# Patient Record
Sex: Male | Born: 1960 | Race: White | Hispanic: No | Marital: Single | Smoking: Former smoker
Health system: Southern US, Community
[De-identification: ages and names within clinical notes are randomized; demographics above are authoritative.]

## PROBLEM LIST (undated history)

## (undated) DIAGNOSIS — T148XXA Other injury of unspecified body region, initial encounter: Secondary | ICD-10-CM

## (undated) DIAGNOSIS — F329 Major depressive disorder, single episode, unspecified: Secondary | ICD-10-CM

## (undated) DIAGNOSIS — E119 Type 2 diabetes mellitus without complications: Secondary | ICD-10-CM

## (undated) DIAGNOSIS — R05 Cough: Secondary | ICD-10-CM

## (undated) DIAGNOSIS — F32A Depression, unspecified: Secondary | ICD-10-CM

## (undated) DIAGNOSIS — I10 Essential (primary) hypertension: Secondary | ICD-10-CM

## (undated) DIAGNOSIS — R059 Cough, unspecified: Secondary | ICD-10-CM

## (undated) DIAGNOSIS — Z5189 Encounter for other specified aftercare: Secondary | ICD-10-CM

## (undated) DIAGNOSIS — C801 Malignant (primary) neoplasm, unspecified: Secondary | ICD-10-CM

## (undated) DIAGNOSIS — F419 Anxiety disorder, unspecified: Secondary | ICD-10-CM

## (undated) DIAGNOSIS — Z8719 Personal history of other diseases of the digestive system: Secondary | ICD-10-CM

## (undated) HISTORY — DX: Encounter for other specified aftercare: Z51.89

## (undated) HISTORY — DX: Essential (primary) hypertension: I10

## (undated) HISTORY — PX: HERNIA REPAIR: SHX51

## (undated) HISTORY — DX: Type 2 diabetes mellitus without complications: E11.9

---

## 1995-03-04 HISTORY — PX: WRIST RECONSTRUCTION: SHX2675

## 2000-01-04 ENCOUNTER — Emergency Department (HOSPITAL_COMMUNITY): Admission: EM | Admit: 2000-01-04 | Discharge: 2000-01-05 | Payer: Self-pay | Admitting: *Deleted

## 2000-01-04 ENCOUNTER — Encounter: Payer: Self-pay | Admitting: *Deleted

## 2012-11-19 ENCOUNTER — Encounter (INDEPENDENT_AMBULATORY_CARE_PROVIDER_SITE_OTHER): Payer: Self-pay | Admitting: General Surgery

## 2012-11-19 ENCOUNTER — Ambulatory Visit (INDEPENDENT_AMBULATORY_CARE_PROVIDER_SITE_OTHER): Payer: No Typology Code available for payment source | Admitting: General Surgery

## 2012-11-19 VITALS — BP 118/74 | HR 71 | Temp 98.6°F | Resp 16 | Ht 71.75 in | Wt 214.4 lb

## 2012-11-19 DIAGNOSIS — K429 Umbilical hernia without obstruction or gangrene: Secondary | ICD-10-CM

## 2012-11-19 NOTE — Progress Notes (Signed)
Patient ID: Allen Walters, male   DOB: [birth year], 52 y.o.   MRN: [id number]  No chief complaint on file.   HPI Allen Walters is a 52 y.o. male.  This patient is referred by Dr. Margo Aye for evaluation of an umbilical hernia. The patient says that he has noticed this for at least 5 years now and has been bigger at times. He said it was much more noticeable when he was heavier but he has lost some weight and it has gotten smaller.  He says that it does cause some discomfort. He describes a "tearing" sensation in the area although he denies any obstructive symptoms such as nausea vomiting or constipation.  He has never had a colonoscopy but denies any family hx of colon cancer.   HPI  Past Medical History  Diagnosis Date  . Hypertension   . Blood transfusion without reported diagnosis     Past Surgical History  Procedure Laterality Date  . Wrist reconstruction Right 1997    No family history on file.  Social History History  Substance Use Topics  . Smoking status: Not on file  . Smokeless tobacco: Not on file  . Alcohol Use: Not on file  nonsmoker, used to drink alcohol but no longer  Allergies no known allergies  Current Outpatient Prescriptions  Medication Sig Dispense Refill  . lisinopril (PRINIVIL,ZESTRIL) 10 MG tablet Take 10 mg by mouth daily.       No current facility-administered medications for this visit.    Review of Systems Review of Systems All other review of systems negative or noncontributory except as stated in the HPI  Blood pressure 118/74, pulse 71, temperature 98.6 F (37 C), temperature source Temporal, resp. rate 16, height 5' 11.75" (1.822 m), weight 214 lb 6.4 oz (97.251 kg).  Physical Exam Physical Exam Physical Exam  Vitals reviewed. Constitutional: He is oriented to person, place, and time. He appears well-developed and well-nourished. No distress.  HENT:  Head: Normocephalic and atraumatic.  Mouth/Throat: No oropharyngeal exudate.  Eyes:  Conjunctivae and EOM are normal. Pupils are equal, round, and reactive to light. Right eye exhibits no discharge. Left eye exhibits no discharge. No scleral icterus.  Neck: Normal range of motion. No tracheal deviation present.  Cardiovascular: Normal rate, regular rhythm and normal heart sounds.   Pulmonary/Chest: Effort normal and breath sounds normal. No stridor. No respiratory distress. He has no wheezes. He has no rales. He exhibits no tenderness.  Abdominal: Soft. Bowel sounds are normal. He exhibits no distension and no mass. There is no tenderness. There is no rebound and no guarding. small and easily reducible umbilical hernia.  nontender on exam today. Musculoskeletal: Normal range of motion. He exhibits no edema and no tenderness.  Neurological: He is alert and oriented to person, place, and time.  Skin: Skin is warm and dry. No rash noted. He is not diaphoretic. No erythema. No pallor.  Psychiatric: He has a normal mood and affect. His behavior is normal. Judgment and thought content normal.    Data Reviewed   Assessment    Reducible umbilical hernia He does have a fairly small but reducible umbilical hernia by history and exam. It is symptomatic however and he would like to have this repaired. We did discuss with him the options for continued observation versus surgical repair and he would like to have this repaired. I think that probably for this small umbilical hernia the best option would be for open umbilical hernia repair with possible  mesh and this is what I have recommended. We did discuss the pros and cons of the procedure and the risks. We did discuss the risk of infection, bleeding, pain, scarring, recurrence, bowel injury, and chronic pain or mesh infection and he expressed understanding and would like to proceed with open umbilical hernia repair with possible mesh    Plan    We will plan for open umbilical hernia repair with possible mesh        Payton Moder  DAVID 11/19/2012, 10:28 AM

## 2012-12-01 NOTE — Pre-Procedure Instructions (Signed)
Allen Walters  12/01/2012   Your procedure is scheduled on:  12/09/12  Report to [facility]  2 * 3 at 530 AM.  Call this number if you have problems the morning of surgery: [phone]   Remember:   Do not eat food or drink liquids after midnight.   Take these medicines the morning of surgery with A SIP OF WATER: none   Do not wear jewelry, make-up or nail polish.  Do not wear lotions, powders, or perfumes. You may wear deodorant.  Do not shave 48 hours prior to surgery. Men may shave face and neck.  Do not bring valuables to the hospital.  [facility] is not responsible                  for any belongings or valuables.               Contacts, dentures or bridgework may not be worn into surgery.  Leave suitcase in the car. After surgery it may be brought to your room.  For patients admitted to the hospital, discharge time is determined by your                treatment team.               Patients discharged the day of surgery will not be allowed to drive  home.  Name and phone number of your driver: family  Special Instructions: Shower using CHG 2 nights before surgery and the night before surgery.  If you shower the day of surgery use CHG.  Use special wash - you have one bottle of CHG for all showers.  You should use approximately 1/3 of the bottle for each shower.   Please read over the following fact sheets that you were given: Pain Booklet, Coughing and Deep Breathing and Surgical Site Infection Prevention

## 2012-12-02 ENCOUNTER — Encounter (HOSPITAL_COMMUNITY)
Admission: RE | Admit: 2012-12-02 | Discharge: 2012-12-02 | Disposition: A | Discharge status: Home / Self Care | Payer: No Typology Code available for payment source | Source: Ambulatory Visit | Attending: General Surgery | Admitting: General Surgery

## 2012-12-02 ENCOUNTER — Encounter (HOSPITAL_COMMUNITY): Payer: Self-pay

## 2012-12-02 ENCOUNTER — Encounter (HOSPITAL_COMMUNITY): Payer: Self-pay | Admitting: Pharmacy Technician

## 2012-12-02 VITALS — BP 148/83 | HR 94 | Temp 98.5°F | Resp 18 | Ht 71.25 in | Wt 212.4 lb

## 2012-12-02 DIAGNOSIS — Z01812 Encounter for preprocedural laboratory examination: Secondary | ICD-10-CM | POA: Insufficient documentation

## 2012-12-02 DIAGNOSIS — Z01818 Encounter for other preprocedural examination: Secondary | ICD-10-CM | POA: Insufficient documentation

## 2012-12-02 DIAGNOSIS — K429 Umbilical hernia without obstruction or gangrene: Secondary | ICD-10-CM

## 2012-12-02 HISTORY — DX: Major depressive disorder, single episode, unspecified: F32.9

## 2012-12-02 HISTORY — DX: Cough: R05

## 2012-12-02 HISTORY — DX: Anxiety disorder, unspecified: F41.9

## 2012-12-02 HISTORY — DX: Personal history of other diseases of the digestive system: Z87.19

## 2012-12-02 HISTORY — DX: Depression, unspecified: F32.A

## 2012-12-02 HISTORY — DX: Malignant (primary) neoplasm, unspecified: C80.1

## 2012-12-02 HISTORY — DX: Other injury of unspecified body region, initial encounter: T14.8XXA

## 2012-12-02 HISTORY — DX: Cough, unspecified: R05.9

## 2012-12-02 LAB — BASIC METABOLIC PANEL
Chloride: 100 mEq/L (ref 96–112)
Creatinine, Ser: 0.76 mg/dL (ref 0.50–1.35)
GFR calc Af Amer: >90 mL/min (ref >90)
Glucose, Bld: 106 mg/dL — ABNORMAL HIGH (ref 70–99)

## 2012-12-02 LAB — CBC
HCT: 42.2 % (ref 39.0–52.0)
MCH: 32.5 pg (ref 26.0–34.0)
Platelets: 340 10*3/uL (ref 150–400)
RDW: 13 % (ref 11.5–15.5)
WBC: 9.6 10*3/uL (ref 4.0–10.5)

## 2012-12-08 MED ORDER — CEFAZOLIN SODIUM-DEXTROSE 2-3 GM-% IV SOLR
2.0000 g | INTRAVENOUS | Status: AC
  Administered 2012-12-09: 2 g via INTRAVENOUS
  Filled 2012-12-08: qty 50

## 2012-12-09 ENCOUNTER — Encounter (HOSPITAL_COMMUNITY): Payer: Self-pay | Admitting: *Deleted

## 2012-12-09 ENCOUNTER — Ambulatory Visit (HOSPITAL_COMMUNITY)
Admission: RE | Admit: 2012-12-09 | Discharge: 2012-12-10 | Disposition: A | Discharge status: Home / Self Care | Payer: No Typology Code available for payment source | Source: Ambulatory Visit | Attending: General Surgery | Admitting: General Surgery

## 2012-12-09 ENCOUNTER — Encounter (HOSPITAL_COMMUNITY)
Admission: RE | Disposition: A | Discharge status: Home / Self Care | Payer: Self-pay | Source: Ambulatory Visit | Attending: General Surgery

## 2012-12-09 ENCOUNTER — Encounter (HOSPITAL_COMMUNITY): Payer: No Typology Code available for payment source | Admitting: Anesthesiology

## 2012-12-09 ENCOUNTER — Ambulatory Visit (HOSPITAL_COMMUNITY): Payer: No Typology Code available for payment source | Admitting: Anesthesiology

## 2012-12-09 DIAGNOSIS — K429 Umbilical hernia without obstruction or gangrene: Secondary | ICD-10-CM | POA: Insufficient documentation

## 2012-12-09 DIAGNOSIS — F3289 Other specified depressive episodes: Secondary | ICD-10-CM | POA: Insufficient documentation

## 2012-12-09 DIAGNOSIS — F329 Major depressive disorder, single episode, unspecified: Secondary | ICD-10-CM | POA: Insufficient documentation

## 2012-12-09 DIAGNOSIS — I10 Essential (primary) hypertension: Secondary | ICD-10-CM | POA: Insufficient documentation

## 2012-12-09 DIAGNOSIS — F411 Generalized anxiety disorder: Secondary | ICD-10-CM | POA: Insufficient documentation

## 2012-12-09 HISTORY — PX: UMBILICAL HERNIA REPAIR: SHX196

## 2012-12-09 HISTORY — PX: INSERTION OF MESH: SHX5868

## 2012-12-09 SURGERY — REPAIR, HERNIA, UMBILICAL, ADULT
Anesthesia: General | Site: Abdomen | Wound class: Clean

## 2012-12-09 MED ORDER — ONDANSETRON HCL 4 MG/2ML IJ SOLN: 4.0000 mg | Freq: Four times a day (QID) | INTRAMUSCULAR | Status: DC | PRN

## 2012-12-09 MED ORDER — LACTATED RINGERS IV SOLN
INTRAVENOUS | Status: DC | PRN
  Administered 2012-12-09 (×2): via INTRAVENOUS

## 2012-12-09 MED ORDER — ONDANSETRON HCL 4 MG/2ML IJ SOLN
INTRAMUSCULAR | Status: DC | PRN
  Administered 2012-12-09: 4 mg via INTRAMUSCULAR

## 2012-12-09 MED ORDER — HYDROCODONE-ACETAMINOPHEN 5-325 MG PO TABS: 1.0000 | ORAL_TABLET | ORAL | Status: DC | PRN

## 2012-12-09 MED ORDER — OXYCODONE HCL 5 MG PO TABS
ORAL_TABLET | ORAL | Status: AC
  Administered 2012-12-09: 5 mg
  Filled 2012-12-09: qty 1

## 2012-12-09 MED ORDER — LIDOCAINE-EPINEPHRINE (PF) 1 %-1:200000 IJ SOLN
INTRAMUSCULAR | Status: AC
  Filled 2012-12-09: qty 10

## 2012-12-09 MED ORDER — HEPARIN SODIUM (PORCINE) 5000 UNIT/ML IJ SOLN
5000.0000 [IU] | Freq: Three times a day (TID) | INTRAMUSCULAR | Status: DC
  Administered 2012-12-09 – 2012-12-10 (×3): 5000 [IU] via SUBCUTANEOUS
  Filled 2012-12-09 (×5): qty 1

## 2012-12-09 MED ORDER — DEXTROSE 5 % IV SOLN
INTRAVENOUS | Status: DC | PRN
  Administered 2012-12-09: 08:00:00 via INTRAVENOUS

## 2012-12-09 MED ORDER — HYDROMORPHONE HCL PF 1 MG/ML IJ SOLN
INTRAMUSCULAR | Status: AC
  Filled 2012-12-09: qty 1

## 2012-12-09 MED ORDER — ONDANSETRON HCL 4 MG PO TABS: 4.0000 mg | ORAL_TABLET | Freq: Four times a day (QID) | ORAL | Status: DC | PRN

## 2012-12-09 MED ORDER — FENTANYL CITRATE 0.05 MG/ML IJ SOLN
INTRAMUSCULAR | Status: DC | PRN
  Administered 2012-12-09: 125 ug via INTRAVENOUS

## 2012-12-09 MED ORDER — KETOROLAC TROMETHAMINE 30 MG/ML IJ SOLN
30.0000 mg | Freq: Once | INTRAMUSCULAR | Status: AC
  Administered 2012-12-09: 30 mg via INTRAVENOUS

## 2012-12-09 MED ORDER — 0.9 % SODIUM CHLORIDE (POUR BTL) OPTIME
TOPICAL | Status: DC | PRN
  Administered 2012-12-09: 1000 mL

## 2012-12-09 MED ORDER — KETOROLAC TROMETHAMINE 30 MG/ML IJ SOLN
INTRAMUSCULAR | Status: AC
  Filled 2012-12-09: qty 1

## 2012-12-09 MED ORDER — PROPOFOL 10 MG/ML IV BOLUS
INTRAVENOUS | Status: DC | PRN
  Administered 2012-12-09: 290 mg via INTRAVENOUS

## 2012-12-09 MED ORDER — LIDOCAINE HCL (CARDIAC) 20 MG/ML IV SOLN
INTRAVENOUS | Status: DC | PRN
  Administered 2012-12-09: 80 mg via INTRAVENOUS

## 2012-12-09 MED ORDER — NEOSTIGMINE METHYLSULFATE 1 MG/ML IJ SOLN
INTRAMUSCULAR | Status: DC | PRN
  Administered 2012-12-09: 3 mg via INTRAVENOUS
  Administered 2012-12-09: 2 mg via INTRAVENOUS

## 2012-12-09 MED ORDER — GLYCOPYRROLATE 0.2 MG/ML IJ SOLN
INTRAMUSCULAR | Status: DC | PRN
  Administered 2012-12-09: .3 mg via INTRAVENOUS
  Administered 2012-12-09: .5 mg via INTRAVENOUS
  Administered 2012-12-09: 0.2 mg via INTRAVENOUS

## 2012-12-09 MED ORDER — HYDROMORPHONE HCL PF 1 MG/ML IJ SOLN
0.2500 mg | INTRAMUSCULAR | Status: DC | PRN
  Administered 2012-12-09 (×6): 0.5 mg via INTRAVENOUS

## 2012-12-09 MED ORDER — BUPIVACAINE HCL (PF) 0.25 % IJ SOLN
INTRAMUSCULAR | Status: AC
  Filled 2012-12-09: qty 30

## 2012-12-09 MED ORDER — PHENYLEPHRINE HCL 10 MG/ML IJ SOLN
INTRAMUSCULAR | Status: DC | PRN
  Administered 2012-12-09: 40 ug via INTRAVENOUS
  Administered 2012-12-09 (×3): 80 ug via INTRAVENOUS

## 2012-12-09 MED ORDER — ROCURONIUM BROMIDE 100 MG/10ML IV SOLN
INTRAVENOUS | Status: DC | PRN
  Administered 2012-12-09: 45 mg via INTRAVENOUS

## 2012-12-09 MED ORDER — HYDROCODONE-ACETAMINOPHEN 5-325 MG PO TABS
1.0000 | ORAL_TABLET | ORAL | Status: DC | PRN
  Administered 2012-12-09 – 2012-12-10 (×6): 2 via ORAL
  Filled 2012-12-09 (×6): qty 2

## 2012-12-09 MED ORDER — ONDANSETRON HCL 4 MG/2ML IJ SOLN: 4.0000 mg | Freq: Once | INTRAMUSCULAR | Status: DC | PRN

## 2012-12-09 MED ORDER — BUPIVACAINE HCL 0.25 % IJ SOLN
INTRAMUSCULAR | Status: DC | PRN
  Administered 2012-12-09: 09:00:00 via INTRAMUSCULAR

## 2012-12-09 SURGICAL SUPPLY — 53 items
ADH SKN CLS APL DERMABOND .7 (GAUZE/BANDAGES/DRESSINGS) ×1
ADH SKN CLS LQ APL DERMABOND (GAUZE/BANDAGES/DRESSINGS) ×1
APL SKNCLS STERI-STRIP NONHPOA (GAUZE/BANDAGES/DRESSINGS)
BALL CTTN LRG ABS STRL LF (GAUZE/BANDAGES/DRESSINGS) ×1
BENZOIN TINCTURE PRP APPL 2/3 (GAUZE/BANDAGES/DRESSINGS) IMPLANT
BLADE SURG 10 STRL SS (BLADE) ×2 IMPLANT
BLADE SURG 15 STRL LF DISP TIS (BLADE) ×1 IMPLANT
BLADE SURG 15 STRL SS (BLADE) ×2
BLADE SURG ROTATE 9660 (MISCELLANEOUS) ×1 IMPLANT
CHLORAPREP W/TINT 26ML (MISCELLANEOUS) ×2 IMPLANT
CLOTH BEACON ORANGE TIMEOUT ST (SAFETY) ×2 IMPLANT
COTTONBALL LRG STERILE PKG (GAUZE/BANDAGES/DRESSINGS) ×2 IMPLANT
COVER SURGICAL LIGHT HANDLE (MISCELLANEOUS) ×2 IMPLANT
DECANTER SPIKE VIAL GLASS SM (MISCELLANEOUS) ×2 IMPLANT
DERMABOND ADHESIVE PROPEN (GAUZE/BANDAGES/DRESSINGS) ×1
DERMABOND ADVANCED (GAUZE/BANDAGES/DRESSINGS) ×1
DERMABOND ADVANCED .7 DNX12 (GAUZE/BANDAGES/DRESSINGS) IMPLANT
DERMABOND ADVANCED .7 DNX6 (GAUZE/BANDAGES/DRESSINGS) IMPLANT
DRAPE PED LAPAROTOMY (DRAPES) ×2 IMPLANT
DRSG TEGADERM 4X4.75 (GAUZE/BANDAGES/DRESSINGS) ×2 IMPLANT
ELECT COATED BLADE 2.86 ST (ELECTRODE) ×2 IMPLANT
ELECT REM PT RETURN 9FT ADLT (ELECTROSURGICAL) ×2
ELECTRODE REM PT RTRN 9FT ADLT (ELECTROSURGICAL) ×1 IMPLANT
GLOVE BIO SURGEON STRL SZ7.5 (GLOVE) ×1 IMPLANT
GLOVE BIOGEL PI IND STRL 7.0 (GLOVE) IMPLANT
GLOVE BIOGEL PI IND STRL 7.5 (GLOVE) IMPLANT
GLOVE BIOGEL PI INDICATOR 7.0 (GLOVE) ×2
GLOVE BIOGEL PI INDICATOR 7.5 (GLOVE) ×1
GLOVE SURG SS PI 7.5 STRL IVOR (GLOVE) ×4 IMPLANT
GOWN STRL NON-REIN LRG LVL3 (GOWN DISPOSABLE) ×2 IMPLANT
GOWN STRL REIN XL XLG (GOWN DISPOSABLE) ×2 IMPLANT
KIT BASIN OR (CUSTOM PROCEDURE TRAY) ×2 IMPLANT
KIT ROOM TURNOVER OR (KITS) ×2 IMPLANT
MESH VENTRALEX ST 1-7/10 CRC S (Mesh General) ×1 IMPLANT
NDL HYPO 25GX1X1/2 BEV (NEEDLE) ×1 IMPLANT
NEEDLE HYPO 25GX1X1/2 BEV (NEEDLE) ×2 IMPLANT
NS IRRIG 1000ML POUR BTL (IV SOLUTION) ×2 IMPLANT
PACK SURGICAL SETUP 50X90 (CUSTOM PROCEDURE TRAY) ×2 IMPLANT
PAD ARMBOARD 7.5X6 YLW CONV (MISCELLANEOUS) ×2 IMPLANT
PENCIL BUTTON HOLSTER BLD 10FT (ELECTRODE) ×2 IMPLANT
SPONGE LAP 18X18 X RAY DECT (DISPOSABLE) ×2 IMPLANT
STRIP CLOSURE SKIN 1/2X4 (GAUZE/BANDAGES/DRESSINGS) IMPLANT
SUT ETHIBOND 0 MO6 C/R (SUTURE) ×2 IMPLANT
SUT MNCRL AB 4-0 PS2 18 (SUTURE) ×2 IMPLANT
SUT PROLENE 2 0 SH DA (SUTURE) ×8 IMPLANT
SUT VIC AB 3-0 SH 27 (SUTURE) ×2
SUT VIC AB 3-0 SH 27X BRD (SUTURE) ×1 IMPLANT
SYR BULB 3OZ (MISCELLANEOUS) ×2 IMPLANT
SYR CONTROL 10ML LL (SYRINGE) ×2 IMPLANT
TOWEL OR 17X24 6PK STRL BLUE (TOWEL DISPOSABLE) ×2 IMPLANT
TOWEL OR 17X26 10 PK STRL BLUE (TOWEL DISPOSABLE) ×2 IMPLANT
TUBE CONNECTING 12X1/4 (SUCTIONS) IMPLANT
YANKAUER SUCT BULB TIP NO VENT (SUCTIONS) ×1 IMPLANT

## 2012-12-09 NOTE — Brief Op Note (Signed)
12/09/2012  8:48 AM  PATIENT:  Allen Walters  52 y.o. male  PRE-OPERATIVE DIAGNOSIS:  UMBILICAL HERNIA  POST-OPERATIVE DIAGNOSIS:  UMBILICAL HERNIA  PROCEDURE:  Procedure(s): HERNIA REPAIR UMBILICAL ADULT (N/A) INSERTION OF MESH (N/A)  SURGEON:  Surgeon(s) and Role:    * [name], DO - Primary  PHYSICIAN ASSISTANT:   ASSISTANTS: none   ANESTHESIA:   general  EBL:  Total I/O In: 1050 [I.V.:1050] Out: -   BLOOD ADMINISTERED:none  DRAINS: none   LOCAL MEDICATIONS USED:  MARCAINE    and LIDOCAINE   SPECIMEN:  No Specimen  DISPOSITION OF SPECIMEN:  N/A  COUNTS:  YES  TOURNIQUET:  * No tourniquets in log *  DICTATION: .Other Dictation: Dictation Number [id number]  PLAN OF CARE: Discharge to home after PACU  PATIENT DISPOSITION:  PACU - hemodynamically stable.   Delay start of Pharmacological VTE agent (>24hrs) due to surgical blood loss or risk of bleeding: no

## 2012-12-09 NOTE — Preoperative (Signed)
Beta Blockers   Reason not to administer Beta Blockers:Not Applicable 

## 2012-12-09 NOTE — Anesthesia Postprocedure Evaluation (Signed)
  Anesthesia Post-op Note  Patient: Allen Walters  Procedure(s) Performed: Procedure(s): HERNIA REPAIR UMBILICAL ADULT (N/A) INSERTION OF MESH (N/A)  Patient Location: PACU  Anesthesia Type:General  Level of Consciousness: awake, alert , oriented and patient cooperative  Airway and Oxygen Therapy: Patient Spontanous Breathing  Post-op Pain: mild  Post-op Assessment: Post-op Vital signs reviewed, Patient's Cardiovascular Status Stable, Respiratory Function Stable, Patent Airway, No signs of Nausea or vomiting and Pain level controlled  Post-op Vital Signs: stable  Complications: No apparent anesthesia complications

## 2012-12-09 NOTE — Progress Notes (Signed)
Dr Katrinka Blazing states pt may have more dilaudid and an Oxy IR before discharge

## 2012-12-09 NOTE — Progress Notes (Signed)
Dr Biagio Quint notified pt states he does not want to go home due to pain.  Order received for toradol 30 mg, and may place pt in observation on med surg unit.

## 2012-12-09 NOTE — Anesthesia Procedure Notes (Signed)
Procedure Name: Intubation Date/Time: 12/09/2012 7:36 AM Performed by: Marni Griffon Pre-anesthesia Checklist: Patient identified, Emergency Drugs available, Suction available and Patient being monitored Patient Re-evaluated:Patient Re-evaluated prior to inductionOxygen Delivery Method: Circle system utilized Preoxygenation: Pre-oxygenation with 100% oxygen Intubation Type: IV induction Ventilation: Mask ventilation without difficulty Laryngoscope Size: Mac and 4 Grade View: Grade I Tube type: Oral Tube size: 7.5 mm Number of attempts: 1 Airway Equipment and Method: Stylet Placement Confirmation: ETT inserted through vocal cords under direct vision,  breath sounds checked- equal and bilateral and positive ETCO2 Secured at: 21 (cm at teeth) cm Tube secured with: Tape Dental Injury: Teeth and Oropharynx as per pre-operative assessment

## 2012-12-09 NOTE — Interval H&P Note (Signed)
History and Physical Interval Note:  12/09/2012 7:21 AM  Allen Walters  has presented today for surgery, with the diagnosis of umbilical hernia   The various methods of treatment have been discussed with the patient and family. After consideration of risks, benefits and other options for treatment, the patient has consented to  Procedure(s): HERNIA REPAIR UMBILICAL ADULT (N/A) INSERTION OF MESH (N/A) as a surgical intervention .  The patient's history has been reviewed, patient examined, no change in status, stable for surgery.  I have reviewed the patient's chart and labs.  Questions were answered to the patient's satisfaction.  I have seen and evaluated the patient in the preop area.  Site marked.  Risks of the procedure again discussed in lay terms including infection, bleeding, pain, scarring, recurrence, chronic pain, persistent symptoms, bowel injury, need for repeat surgery and he expressed understanding and desires to proceed with open umbilical hernia with or without mesh.   [name] DAVID

## 2012-12-09 NOTE — H&P (View-Only) (Signed)
Patient ID: Allen Walters, male   DOB: 1960/05/07, 52 y.o.   MRN: [id number]  No chief complaint on file.   HPI Allen Walters is a 52 y.o. male.  This patient is referred by Dr. Margo Walters for evaluation of an umbilical hernia. The patient says that he has noticed this for at least 5 years now and has been bigger at times. He said it was much more noticeable when he was heavier but he has lost some weight and it has gotten smaller.  He says that it does cause some discomfort. He describes a "tearing" sensation in the area although he denies any obstructive symptoms such as nausea vomiting or constipation.  He has never had a colonoscopy but denies any family hx of colon cancer.   HPI  Past Medical History  Diagnosis Date  . Hypertension   . Blood transfusion without reported diagnosis     Past Surgical History  Procedure Laterality Date  . Wrist reconstruction Right 1997    No family history on file.  Social History History  Substance Use Topics  . Smoking status: Not on file  . Smokeless tobacco: Not on file  . Alcohol Use: Not on file  nonsmoker, used to drink alcohol but no longer  Allergies no known allergies  Current Outpatient Prescriptions  Medication Sig Dispense Refill  . lisinopril (PRINIVIL,ZESTRIL) 10 MG tablet Take 10 mg by mouth daily.       No current facility-administered medications for this visit.    Review of Systems Review of Systems All other review of systems negative or noncontributory except as stated in the HPI  Blood pressure 118/74, pulse 71, temperature 98.6 F (37 C), temperature source Temporal, resp. rate 16, height 5' 11.75" (1.822 m), weight 214 lb 6.4 oz (97.251 kg).  Physical Exam Physical Exam Physical Exam  Vitals reviewed. Constitutional: He is oriented to person, place, and time. He appears well-developed and well-nourished. No distress.  HENT:  Head: Normocephalic and atraumatic.  Mouth/Throat: No oropharyngeal exudate.  Eyes:  Conjunctivae and EOM are normal. Pupils are equal, round, and reactive to light. Right eye exhibits no discharge. Left eye exhibits no discharge. No scleral icterus.  Neck: Normal range of motion. No tracheal deviation present.  Cardiovascular: Normal rate, regular rhythm and normal heart sounds.   Pulmonary/Chest: Effort normal and breath sounds normal. No stridor. No respiratory distress. He has no wheezes. He has no rales. He exhibits no tenderness.  Abdominal: Soft. Bowel sounds are normal. He exhibits no distension and no mass. There is no tenderness. There is no rebound and no guarding. small and easily reducible umbilical hernia.  nontender on exam today. Musculoskeletal: Normal range of motion. He exhibits no edema and no tenderness.  Neurological: He is alert and oriented to person, place, and time.  Skin: Skin is warm and dry. No rash noted. He is not diaphoretic. No erythema. No pallor.  Psychiatric: He has a normal mood and affect. His behavior is normal. Judgment and thought content normal.    Data Reviewed   Assessment    Reducible umbilical hernia He does have a fairly small but reducible umbilical hernia by history and exam. It is symptomatic however and he would like to have this repaired. We did discuss with him the options for continued observation versus surgical repair and he would like to have this repaired. I think that probably for this small umbilical hernia the best option would be for open umbilical hernia repair with possible  mesh and this is what I have recommended. We did discuss the pros and cons of the procedure and the risks. We did discuss the risk of infection, bleeding, pain, scarring, recurrence, bowel injury, and chronic pain or mesh infection and he expressed understanding and would like to proceed with open umbilical hernia repair with possible mesh    Plan    We will plan for open umbilical hernia repair with possible mesh        Allen Walters  Allen Walters 11/19/2012, 10:28 AM

## 2012-12-09 NOTE — Anesthesia Preprocedure Evaluation (Addendum)
Anesthesia Evaluation  Patient identified by MRN, date of birth, ID band Patient awake    Reviewed: Allergy & Precautions, H&P , NPO status , Patient's Chart, lab work & pertinent test results, reviewed documented beta blocker date and time   Airway Mallampati: I TM Distance: >3 FB Neck ROM: full    Dental  (+) Teeth Intact and Dental Advisory Given   Pulmonary former smoker,          Cardiovascular hypertension, Pt. on medications Rhythm:regular Rate:Normal     Neuro/Psych Anxiety Depression    GI/Hepatic hiatal hernia,   Endo/Other    Renal/GU      Musculoskeletal   Abdominal   Peds  Hematology   Anesthesia Other Findings   Reproductive/Obstetrics                          Anesthesia Physical Anesthesia Plan  ASA: II  Anesthesia Plan: General   Post-op Pain Management:    Induction: Intravenous  Airway Management Planned: Oral ETT and LMA  Additional Equipment:   Intra-op Plan:   Post-operative Plan: Extubation in OR  Informed Consent: I have reviewed the patients History and Physical, chart, labs and discussed the procedure including the risks, benefits and alternatives for the proposed anesthesia with the patient or authorized representative who has indicated his/her understanding and acceptance.     Plan Discussed with: CRNA, Anesthesiologist and Surgeon  Anesthesia Plan Comments:         Anesthesia Quick Evaluation

## 2012-12-09 NOTE — Transfer of Care (Signed)
Immediate Anesthesia Transfer of Care Note  Patient: Allen Walters  Procedure(s) Performed: Procedure(s): HERNIA REPAIR UMBILICAL ADULT (N/A) INSERTION OF MESH (N/A)  Patient Location: PACU  Anesthesia Type:General  Level of Consciousness: awake and confused  Airway & Oxygen Therapy: Patient Spontanous Breathing and Patient connected to face mask oxygen  Post-op Assessment: Report given to PACU RN, Post -op Vital signs reviewed and stable and Patient moving all extremities  Post vital signs: Reviewed and stable  Complications: No apparent anesthesia complications

## 2012-12-10 ENCOUNTER — Encounter (HOSPITAL_COMMUNITY): Payer: Self-pay | Admitting: General Surgery

## 2012-12-10 NOTE — Discharge Summary (Signed)
  Physician Discharge Summary  Patient ID: Allen Walters MRN: [id number] DOB/AGE: 1960-05-16 52 y.o.  Admit date: 12/09/2012 Discharge date: 12/10/2012  Admission Diagnoses: umbilical hernia  Discharge Diagnoses: same Active Problems:   * No active hospital problems. *   Discharged Condition: stable  Hospital Course: to OR 12/09/12 for open umbilical hernia repair with mesh.  Stayed overnight for pain control but was tolerating diet and HD stable and ready for discharge on POD 1  Consults: None  Significant Diagnostic Studies: none  Treatments: surgery: 12/09/12 open umbilical hernia repair with mesh   Disposition: Final discharge disposition not confirmed  Discharge Orders   Future Appointments Provider Department Dept Phone   12/23/2012 1:00 PM [name], DO [facility], [place] [phone]   Future Orders Complete By Expires   Call MD for:  persistant nausea and vomiting  As directed    Call MD for:  persistant nausea and vomiting  As directed    Call MD for:  redness, tenderness, or signs of infection (pain, swelling, redness, odor or green/yellow discharge around incision site)  As directed    Call MD for:  redness, tenderness, or signs of infection (pain, swelling, redness, odor or green/yellow discharge around incision site)  As directed    Call MD for:  severe uncontrolled pain  As directed    Call MD for:  severe uncontrolled pain  As directed    Call MD for:  temperature >100.4  As directed    Call MD for:  temperature >100.4  As directed    Diet - low sodium heart healthy  As directed    Diet - low sodium heart healthy  As directed    Discharge instructions  As directed    Comments:     May remove dressing and shower in 48 hours. Call [phone] to schedule follow up appointment in about 3 weeks. No lifting more than 10lbs for 4 weeks.   Discharge instructions  As directed    Comments:     May remove dressing tomorrow and shower tomorrow. No lifting  more than 10lbs for 4 weeks. Call [phone] to schedule follow up appointment for about 3 weeks.   Increase activity slowly  As directed    Increase activity slowly  As directed        Medication List         diazepam 5 MG tablet  Commonly known as:  VALIUM  Take 5 mg by mouth 2 (two) times daily.     HYDROcodone-acetaminophen 5-325 MG per tablet  Commonly known as:  NORCO  Take 1 tablet by mouth every 4 (four) hours as needed for pain.     lisinopril 10 MG tablet  Commonly known as:  PRINIVIL,ZESTRIL  Take 10 mg by mouth daily.         SignedLodema Pilot DAVID 12/10/2012, 8:20 AM

## 2012-12-10 NOTE — Progress Notes (Signed)
1 Day Post-Op  Subjective: No issues overnight.  Tolerating diet  Objective: Vital signs in last 24 hours: Temp:  [97.1 F (36.2 C)-98.7 F (37.1 C)] 98.5 F (36.9 C) (10/10 0613) Pulse Rate:  [71-109] 95 (10/10 0613) Resp:  [6-25] 16 (10/10 0613) BP: (126-163)/(66-118) 153/91 mmHg (10/10 0613) SpO2:  [92 %-100 %] 97 % (10/10 0613) Weight:  [216 lb 11.4 oz (98.3 kg)] 216 lb 11.4 oz (98.3 kg) (10/09 1222) Last BM Date: 12/08/12  Intake/Output from previous day: 10/09 0701 - 10/10 0700 In: 2340 [P.O.:240; I.V.:2100] Out: 1885 [Urine:1875; Blood:10] Intake/Output this shift: Total I/O In: -  Out: 400 [Urine:400]  General appearance: alert, cooperative and no distress Resp: clear to auscultation bilaterally Cardio: regular rate and rhythm, S1, S2 normal, no murmur, click, rub or gallop GI: soft, appropriate incisional tenderness, ND, dressing clean and intact  Lab Results:  No results found for this basename: WBC, HGB, HCT, PLT,  in the last 72 hours BMET No results found for this basename: NA, K, CL, CO2, GLUCOSE, BUN, CREATININE, CALCIUM,  in the last 72 hours PT/INR No results found for this basename: LABPROT, INR,  in the last 72 hours ABG No results found for this basename: PHART, PCO2, PO2, HCO3,  in the last 72 hours  Studies/Results: No results found.  Anti-infectives: Anti-infectives   Start     Dose/Rate Route Frequency Ordered Stop   12/09/12 0600  ceFAZolin (ANCEF) IVPB 2 g/50 mL premix     2 g 100 mL/hr over 30 Minutes Intravenous On call to O.R. 12/08/12 1442 12/09/12 0740      Assessment/Plan: s/p Procedure(s): HERNIA REPAIR UMBILICAL ADULT (N/A) INSERTION OF MESH (N/A) He looks fine this morning.  should be okay for discharge to home  LOS: 1 day    [name] DAVID 12/10/2012

## 2012-12-10 NOTE — Op Note (Signed)
NAMEORAN, DILLENBURG NO.:  [id number]  MEDICAL RECORD NO.:  [id number]  LOCATION:  6N23C                        FACILITY:  [facility]  PHYSICIAN:  [name], MD       DATE OF BIRTH:  06/07/1960  DATE OF PROCEDURE:  12/09/2012 DATE OF DISCHARGE:                              OPERATIVE REPORT   PROCEDURE:  Open umbilical hernia repair with mesh.  PREOPERATIVE DIAGNOSIS:  Umbilical hernia.  POSTOPERATIVE DIAGNOSIS:  Umbilical hernia.  SURGEON:  [name], MD  ASSISTANT:  None.  ANESTHESIA:  General endotracheal anesthesia with 30 mL of 1% lidocaine with epinephrine and 0.25% Marcaine in a 50:50 mixture.  FLUIDS:  1900 mL crystalloid.  ESTIMATED BLOOD LOSS:  Minimal.  DRAINS:  None.  SPECIMENS:  None.  COMPLICATIONS:  None apparent.  FINDINGS:  Umbilical hernia containing preperitoneal fat.  No evidence of intestinal contents.  Primary repair over 4.3 cm Ventralex ST patch.  INDICATIONS FOR PROCEDURE:  Mr. 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  OPERATIVE DETAILS:  Mr. Ramonita Lab was seen and evaluated in the preoperative area and risks and benefits of procedure were discussed again in lay terms.  Informed consent was obtained.  Surgical site was marked prior to anesthetic administration, and he was taken to the operating room, given prophylactic antibiotics and general endotracheal anesthesia was obtained.  His abdomen was prepped and draped in a standard surgical fashion and procedure time-out was performed with all operative team members to confirm proper patient, procedure.  An infraumbilical incision was made in the skin and dissection carried down to the underlying fascia using Bovie electrocautery.  The umbilical stalk was elevated from the underlying fascia and the hernia sac was entered.  The hernia was noted to contain preperitoneal fat.  There  was no evidence of bowel contents and the peritoneum was not entered.  The defect was about the size of my thumb but the surrounding fascia appeared healthy.  The undersurface of the fascia was cleared using blunt dissection, and the mid space for a placement of a umbilical hernia patch because he does do quite a bit of strenuous lifting with his occupation.  A 4.3 cm Ventralex ST patch was selected and 2-0 Prolene sutures were placed at the 12 o'clock, 3 o'clock, 6 o'clock, and 9 o'clock positions on the mesh.  He did up through the fascia from the under surface using double- armed suture with each stitch, placed under direct visualization to ensure no injury to the underlying bowel contents.  The stitches and sutures were placed and the mesh was placed in the preperitoneal space and the sutures were tightened and secured.  The mesh covered the defect and then the fascial edges were approximated over the mesh with 0 Ethibond sutures, incorporating the tail of the mesh to ensure that the mesh lay flat up against the abdominal wall.  The sutures were secured and the mesh was completely covered.  The wound was injected with 30 mL of 1% lidocaine with epinephrine and 0.25% Marcaine in a 50:50 mixture. The wound was  irrigated with sterile saline solution and umbilical stalk.  The wound was noted to be hemostatic.  The umbilical stalk was tacked to the underlying fascia with two 3-0 Vicryl sutures and sutures were secured and the dermis was approximated with interrupted 3-0 Vicryl sutures.  Skin edges were approximated with 4-0 Monocryl subcuticular suture, and skin was washed and dried and Dermabond was applied. Sterile suction dressing was applied.  The patient tolerated the procedure well without apparent complications.  All sponge, needle, and instrument counts were correct in the case.          ______________________________ [name], MD     BL/MEDQ  D:  12/09/2012  T:   12/10/2012  Job:  [id number]

## 2012-12-14 ENCOUNTER — Other Ambulatory Visit (INDEPENDENT_AMBULATORY_CARE_PROVIDER_SITE_OTHER): Payer: Self-pay | Admitting: *Deleted

## 2012-12-14 ENCOUNTER — Telehealth (INDEPENDENT_AMBULATORY_CARE_PROVIDER_SITE_OTHER): Payer: Self-pay

## 2012-12-14 MED ORDER — HYDROCODONE-ACETAMINOPHEN 5-325 MG PO TABS: 1.0000 | ORAL_TABLET | Freq: Four times a day (QID) | ORAL | Status: DC | PRN

## 2012-12-14 NOTE — Telephone Encounter (Signed)
Prescription filled out and signed by Dr. Donell Beers in urgent office.  Patient aware and will come about 345p to pick up

## 2012-12-14 NOTE — Telephone Encounter (Signed)
Spoke to Dr. Biagio Quint and refill for Hydrocodone 5/325mg  has been approved.  Patient to take 1-2 tablets every 6 hours as needed for pain #40.  Message to be routed to urgent office to be signed.

## 2012-12-14 NOTE — Telephone Encounter (Signed)
Patient is asking for refill hydrocodone , He states he had to double the dose due to extreme pain. Please advise

## 2012-12-23 ENCOUNTER — Encounter (INDEPENDENT_AMBULATORY_CARE_PROVIDER_SITE_OTHER): Payer: Self-pay | Admitting: General Surgery

## 2012-12-23 ENCOUNTER — Ambulatory Visit (INDEPENDENT_AMBULATORY_CARE_PROVIDER_SITE_OTHER): Payer: No Typology Code available for payment source | Admitting: General Surgery

## 2012-12-23 VITALS — BP 130/86 | HR 80 | Temp 98.6°F | Resp 16 | Ht 72.0 in | Wt 213.4 lb

## 2012-12-23 DIAGNOSIS — Z5189 Encounter for other specified aftercare: Secondary | ICD-10-CM

## 2012-12-23 DIAGNOSIS — Z4889 Encounter for other specified surgical aftercare: Secondary | ICD-10-CM

## 2012-12-23 MED ORDER — HYDROCODONE-ACETAMINOPHEN 5-325 MG PO TABS: 1.0000 | ORAL_TABLET | Freq: Four times a day (QID) | ORAL | Status: AC | PRN

## 2012-12-23 NOTE — Progress Notes (Signed)
Subjective:     Patient ID: Allen Walters, male   DOB: Jun 24, 1960, 52 y.o.   MRN: [id number]  HPI This patient follows up 2 weeks status post open umbilical hernia repair with mesh.  He has been doing well. He says that he still takes some occasional pain medications but his bowels are functioning normally. He says that he is still taking it easy with his work and his only "complaint" is that his appetite is not as good as it was preoperatively.  Review of Systems     Objective:   Physical Exam No acute distress and nontoxic-appearing His abdomen is soft and nontender on exam his incision is healing nicely without any sign of infection. There is no evidence of recurrent hernia with Valsalva    Assessment:     Status post open umbilical hernia with Mesh-doing well He seems to be doing fairly well from his procedure without any evidence of postoperative complication. Think that the wound looks good and there is no evidence of recurrence. I recommended that he continue with light duty and lifting instructions for another 4 weeks and then at that time he can gradually increase his activity as tolerated.  He did ask for a refill of his pain medication I told him that he would only get this refilled and I would not refill again unless there was evidence of any postoperative problems    Plan:     Light duty and lifting restrictions for another 4 weeks Refill hydrocodone #40 Followup as needed and

## 2014-12-12 IMAGING — CR DG CHEST 2V
2 series · 2 of 2 positions shown · non-contrast
Comparison: None.

CLINICAL DATA: Preop for hernia repair

EXAM:
CHEST  2 VIEW

[w chest pa]
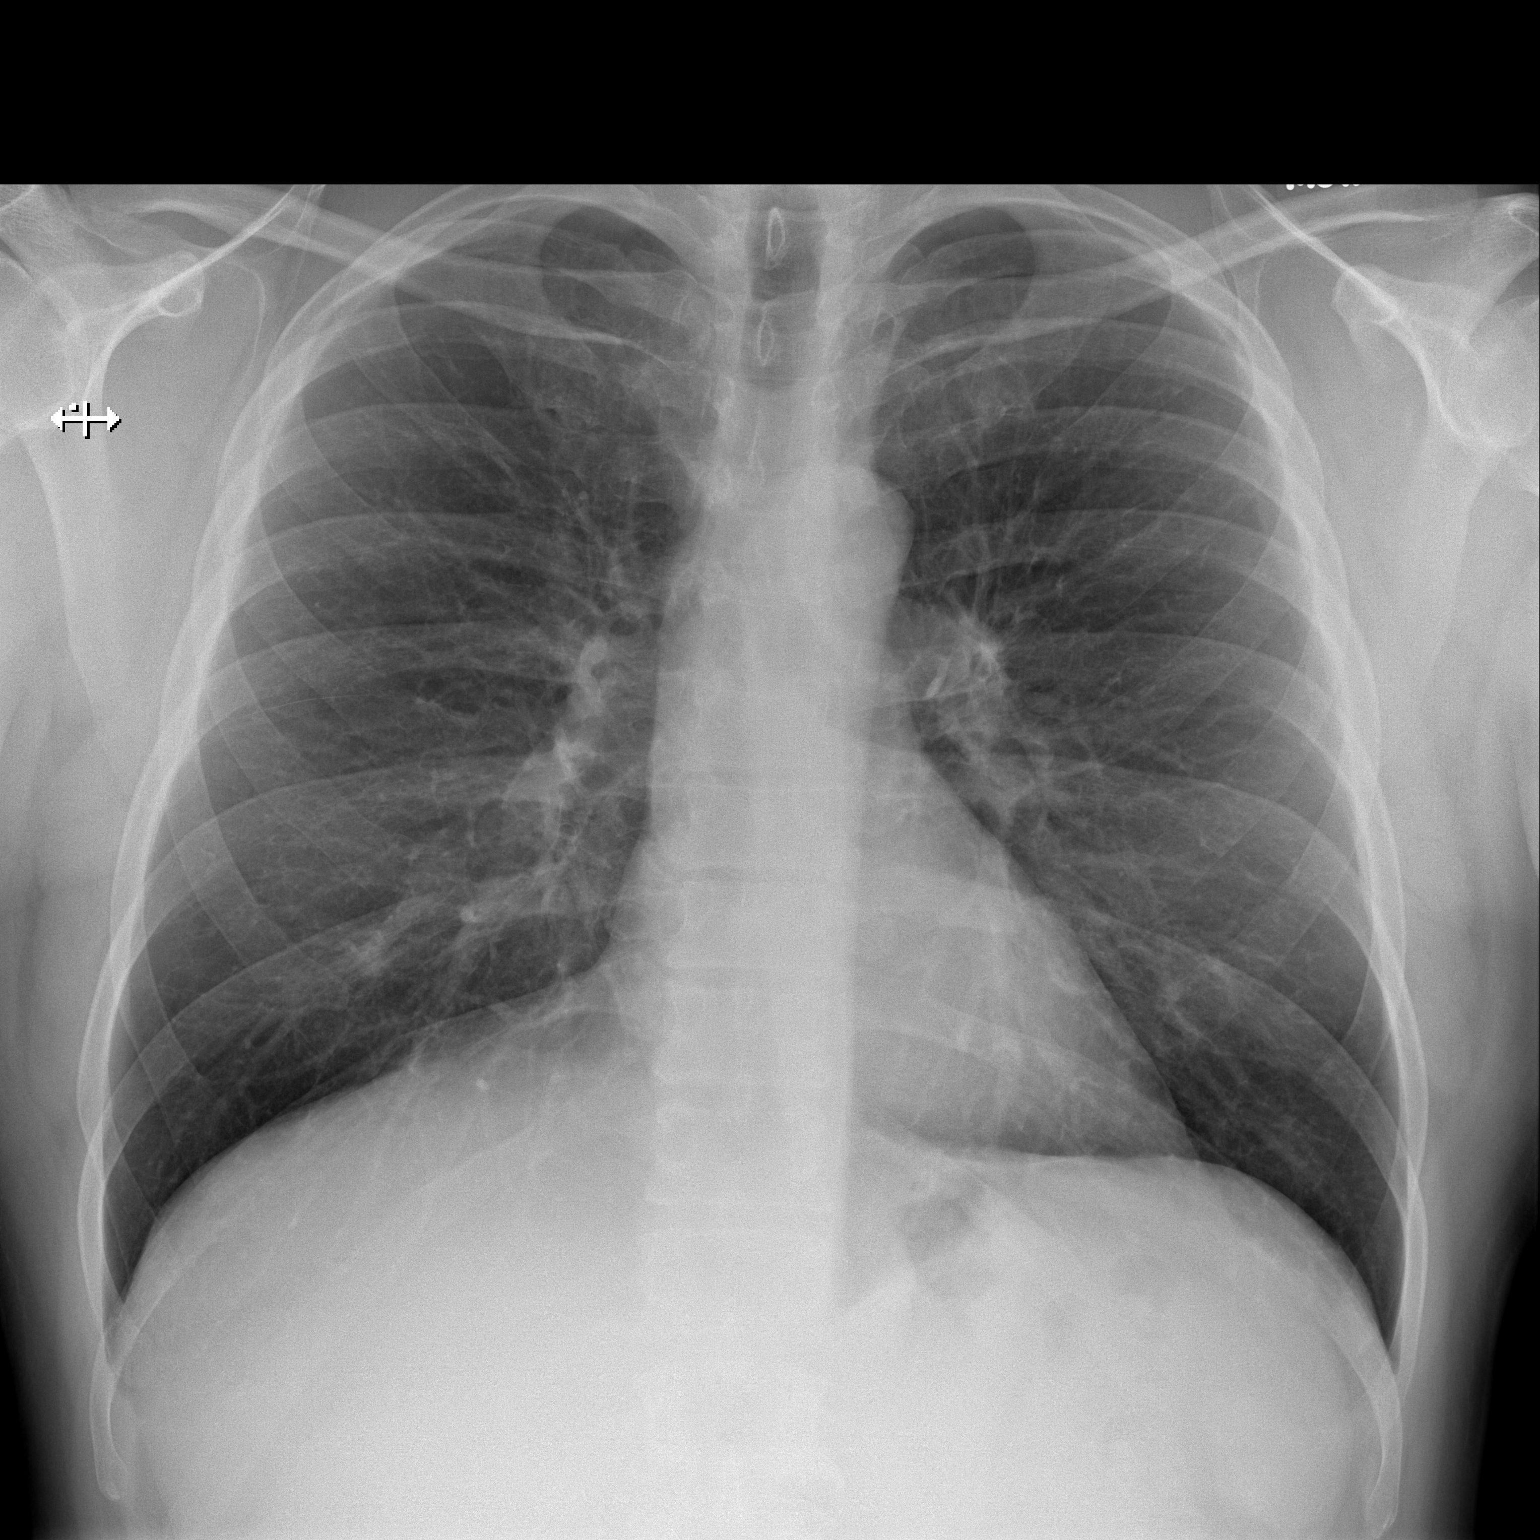

[w chest lat]
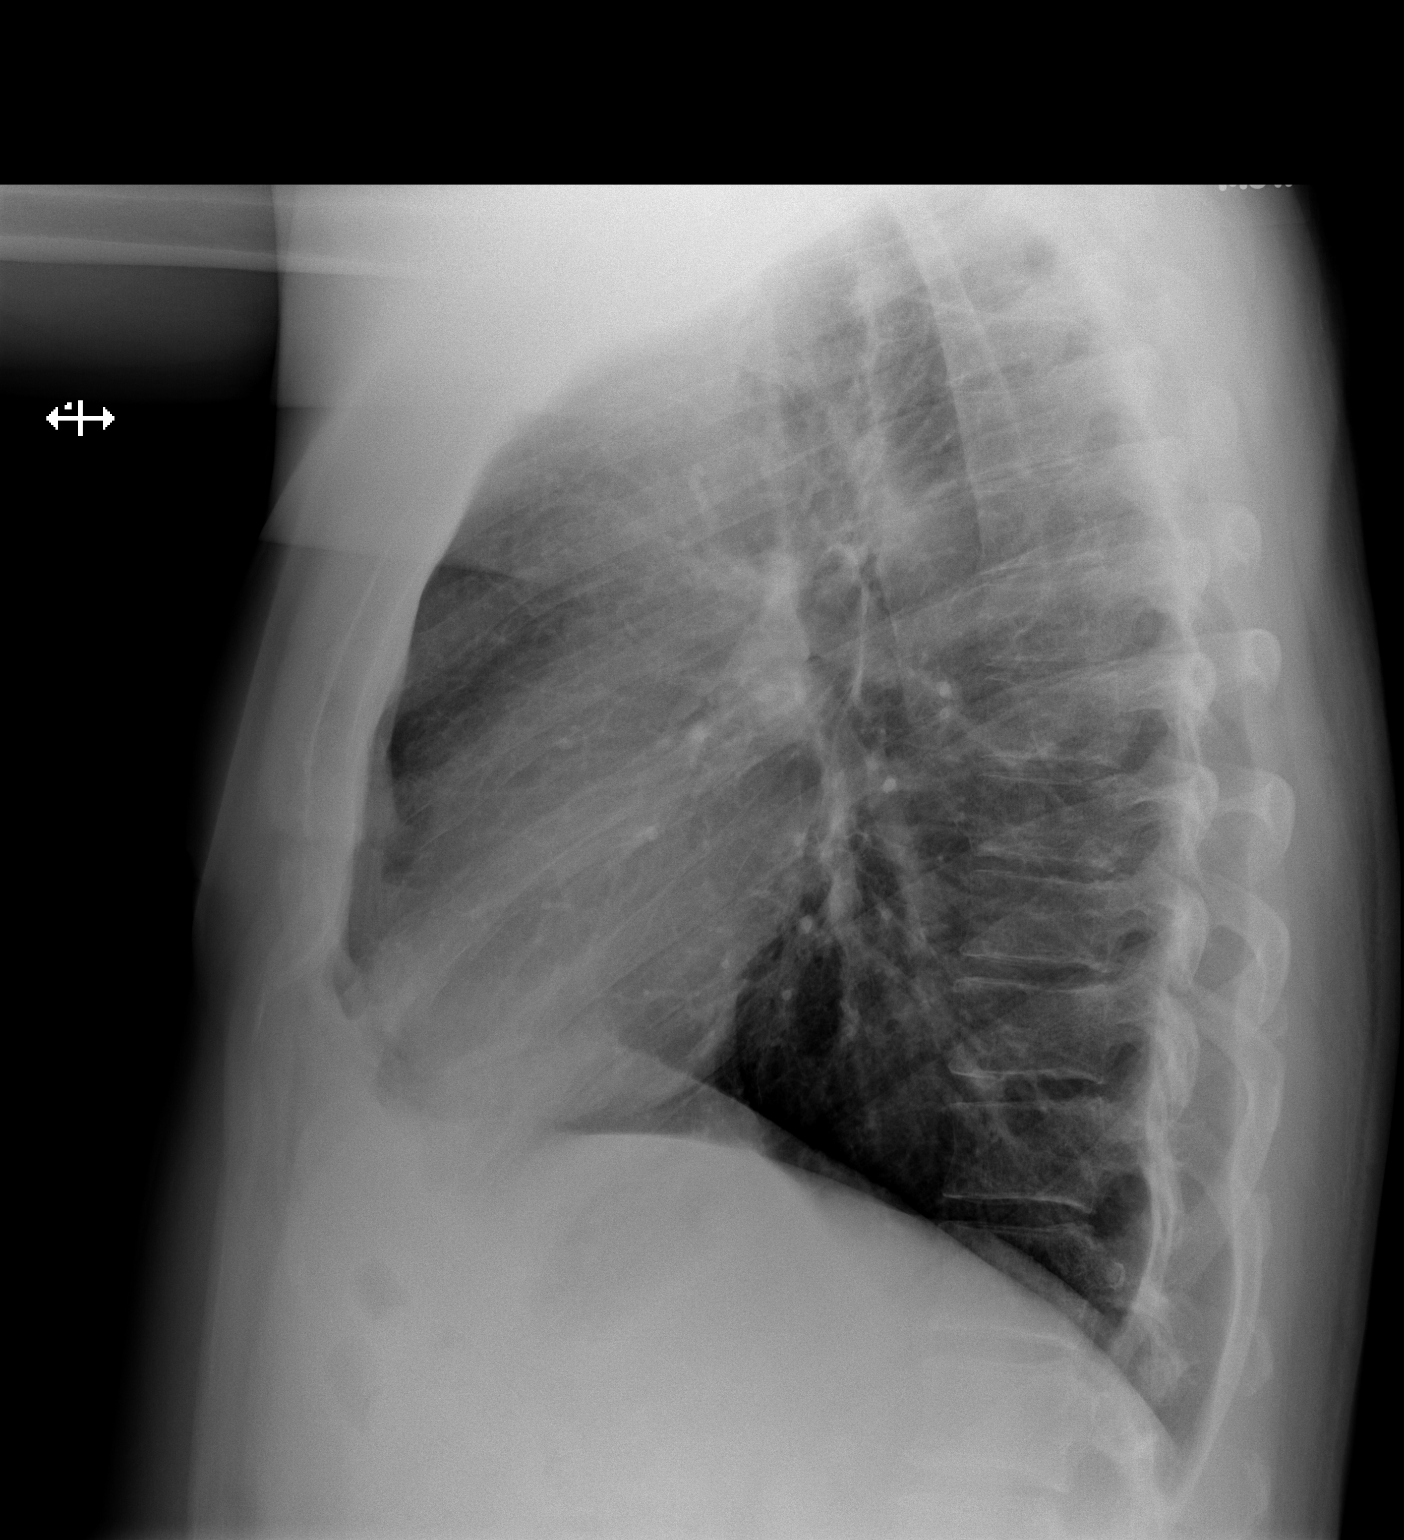

[2 of 2 positions shown; findings below may reference images not displayed]

FINDINGS: The heart size and mediastinal contours are within normal limits. No
acute infiltrate or pulmonary edema. No pleural effusion. The
visualized skeletal structures are unremarkable.
IMPRESSION: No active cardiopulmonary disease.

## 2020-05-15 ENCOUNTER — Ambulatory Visit (INDEPENDENT_AMBULATORY_CARE_PROVIDER_SITE_OTHER): Payer: 59 | Admitting: Orthopedic Surgery

## 2020-05-15 ENCOUNTER — Encounter: Payer: Self-pay | Admitting: Orthopedic Surgery

## 2020-05-15 ENCOUNTER — Other Ambulatory Visit: Payer: Self-pay

## 2020-05-15 VITALS — BP 122/82 | HR 97 | Temp 97.5°F | Resp 18 | Ht 72.0 in | Wt 219.4 lb

## 2020-05-15 DIAGNOSIS — I10 Essential (primary) hypertension: Secondary | ICD-10-CM | POA: Diagnosis not present

## 2020-05-15 DIAGNOSIS — E1165 Type 2 diabetes mellitus with hyperglycemia: Secondary | ICD-10-CM | POA: Insufficient documentation

## 2020-05-15 DIAGNOSIS — R5383 Other fatigue: Secondary | ICD-10-CM

## 2020-05-15 DIAGNOSIS — Z6829 Body mass index (BMI) 29.0-29.9, adult: Secondary | ICD-10-CM

## 2020-05-15 DIAGNOSIS — M546 Pain in thoracic spine: Secondary | ICD-10-CM

## 2020-05-15 DIAGNOSIS — H25013 Cortical age-related cataract, bilateral: Secondary | ICD-10-CM

## 2020-05-15 DIAGNOSIS — Z636 Dependent relative needing care at home: Secondary | ICD-10-CM | POA: Insufficient documentation

## 2020-05-15 DIAGNOSIS — E1169 Type 2 diabetes mellitus with other specified complication: Secondary | ICD-10-CM

## 2020-05-15 DIAGNOSIS — G8929 Other chronic pain: Secondary | ICD-10-CM | POA: Insufficient documentation

## 2020-05-15 DIAGNOSIS — M1990 Unspecified osteoarthritis, unspecified site: Secondary | ICD-10-CM

## 2020-05-15 DIAGNOSIS — E785 Hyperlipidemia, unspecified: Secondary | ICD-10-CM

## 2020-05-15 DIAGNOSIS — Z818 Family history of other mental and behavioral disorders: Secondary | ICD-10-CM

## 2020-05-15 LAB — CBC WITH DIFFERENTIAL/PLATELET
Basophils Relative: 0.7 %
Hemoglobin: 15.3 g/dL (ref 13.2–17.1)
MCV: 88.5 fL (ref 80.0–100.0)
Neutrophils Relative %: 59.3 %
Platelets: 333 10*3/uL (ref 140–400)

## 2020-05-15 LAB — BASIC METABOLIC PANEL
CO2: 23 mmol/L (ref 20–32)
Creat: 0.69 mg/dL — ABNORMAL LOW (ref 0.70–1.25)
Glucose, Bld: 122 mg/dL — ABNORMAL HIGH (ref 65–99)

## 2020-05-15 LAB — HEPATIC FUNCTION PANEL
AST: 26 U/L (ref 10–35)
Alkaline phosphatase (APISO): 40 U/L (ref 35–144)

## 2020-05-15 LAB — LIPID PANEL: Cholesterol: 215 mg/dL — ABNORMAL HIGH (ref <200)

## 2020-05-15 NOTE — Patient Instructions (Signed)
Tylenol- may take 1000 mg by mouth 2-3 times daily.   Health Maintenance, Male Adopting a healthy lifestyle and getting preventive care are important in promoting health and wellness. Ask your health care provider about:  The right schedule for you to have regular tests and exams.  Things you can do on your own to prevent diseases and keep yourself healthy. What should I know about diet, weight, and exercise? Eat a healthy diet  Eat a diet that includes plenty of vegetables, fruits, low-fat dairy products, and lean protein.  Do not eat a lot of foods that are high in solid fats, added sugars, or sodium.   Maintain a healthy weight Body mass index (BMI) is a measurement that can be used to identify possible weight problems. It estimates body fat based on height and weight. Your health care provider can help determine your BMI and help you achieve or maintain a healthy weight. Get regular exercise Get regular exercise. This is one of the most important things you can do for your health. Most adults should:  Exercise for at least 150 minutes each week. The exercise should increase your heart rate and make you sweat (moderate-intensity exercise).  Do strengthening exercises at least twice a week. This is in addition to the moderate-intensity exercise.  Spend less time sitting. Even light physical activity can be beneficial. Watch cholesterol and blood lipids Have your blood tested for lipids and cholesterol at 61 years of age, then have this test every 5 years. You may need to have your cholesterol levels checked more often if:  Your lipid or cholesterol levels are high.  You are older than 61 years of age.  You are at high risk for heart disease. What should I know about cancer screening? Many types of cancers can be detected early and may often be prevented. Depending on your health history and family history, you may need to have cancer screening at various ages. This may include  screening for:  Colorectal cancer.  Prostate cancer.  Skin cancer.  Lung cancer. What should I know about heart disease, diabetes, and high blood pressure? Blood pressure and heart disease  High blood pressure causes heart disease and increases the risk of stroke. This is more likely to develop in people who have high blood pressure readings, are of African descent, or are overweight.  Talk with your health care provider about your target blood pressure readings.  Have your blood pressure checked: ? Every 3-5 years if you are 7-29 years of age. ? Every year if you are 76 years old or older.  If you are between the ages of 50 and 25 and are a current or former smoker, ask your health care provider if you should have a one-time screening for abdominal aortic aneurysm (AAA). Diabetes Have regular diabetes screenings. This checks your fasting blood sugar level. Have the screening done:  Once every three years after age 50 if you are at a normal weight and have a low risk for diabetes.  More often and at a younger age if you are overweight or have a high risk for diabetes. What should I know about preventing infection? Hepatitis B If you have a higher risk for hepatitis B, you should be screened for this virus. Talk with your health care provider to find out if you are at risk for hepatitis B infection. Hepatitis C Blood testing is recommended for:  Everyone born from [birth year] through 1966.  Anyone with known risk factors for  hepatitis C. Sexually transmitted infections (STIs)  You should be screened each year for STIs, including gonorrhea and chlamydia, if: ? You are sexually active and are younger than 61 years of age. ? You are older than 61 years of age and your health care provider tells you that you are at risk for this type of infection. ? Your sexual activity has changed since you were last screened, and you are at increased risk for chlamydia or gonorrhea. Ask your health  care provider if you are at risk.  Ask your health care provider about whether you are at high risk for HIV. Your health care provider may recommend a prescription medicine to help prevent HIV infection. If you choose to take medicine to prevent HIV, you should first get tested for HIV. You should then be tested every 3 months for as long as you are taking the medicine. Follow these instructions at home: Lifestyle  Do not use any products that contain nicotine or tobacco, such as cigarettes, e-cigarettes, and chewing tobacco. If you need help quitting, ask your health care provider.  Do not use street drugs.  Do not share needles.  Ask your health care provider for help if you need support or information about quitting drugs. Alcohol use  Do not drink alcohol if your health care provider tells you not to drink.  If you drink alcohol: ? Limit how much you have to 0-2 drinks a day. ? Be aware of how much alcohol is in your drink. In the [place] one drink equals one 12 oz bottle of beer (355 mL), one 5 oz glass of wine (148 mL), or one 1 oz glass of hard liquor (44 mL). General instructions  Schedule regular health, dental, and eye exams.  Stay current with your vaccines.  Tell your health care provider if: ? You often feel depressed. ? You have ever been abused or do not feel safe at home. Summary  Adopting a healthy lifestyle and getting preventive care are important in promoting health and wellness.  Follow your health care provider's instructions about healthy diet, exercising, and getting tested or screened for diseases.  Follow your health care provider's instructions on monitoring your cholesterol and blood pressure. This information is not intended to replace advice given to you by your health care provider. Make sure you discuss any questions you have with your health care provider. Document Revised: 02/10/2018 Document Reviewed: 02/10/2018 Elsevier Patient Education  2021  [employer].

## 2020-05-15 NOTE — Progress Notes (Signed)
Careteam: No care team member to display  Seen by: [name], AGNP-C  PLACE OF SERVICE:  [place] Directive information Does Patient Have a Medical Advance Directive?: No, Would patient like information on creating a medical advance directive?: No - Patient declined  No Known Allergies  Chief Complaint  Patient presents with  . Establish Care    Establish Care     HPI: Patient is a 60 y.o. male seen today to establish at [facility]. Medical records requested from previous Provider.   Previous patient at [facility]. Previous provider Randie Heinz. In network and wanted to switch provider, mother patient at [facility]Currently works as Dealer at [employer],  plans to retire soon. Past career in Event organiser. Main caregiver for 88 y.o. mother. Single. Daughter- does not have relationship with her. He has three siblings.   Hypertension- diagnosed around age 80. Has been on lisinopril since diagnosis, reports dosage has increased with age. Tries to follow low sodium diet. Admits to eating lots of deli meat and fast food. Drinks one cup coffee daily. Denies chest pain, sob, blurred vision, headaches.   Type 2 diabetes- diagnosed around 2015-2016. Initially started on metformin and over the years dosage has increased. Also taking ozempic, started about 1 year ago. Family history of diabetes includes brother and mother. Admits to poor diet. Tries to limit carbs.  Eliminated soda drinking awhile ago. Always wears shoes and does not walk barefoot. Reports cracked spot on right foot. Denies polyuria, polyphagia and polydipsia.   Caregiver strain- takes care of his 37 year old mother. Sometimes feels helpless when taking care of his mother. Siblings do not help. Expresses he feels stressed at times. Denies depression and anxiety.   Fatigue- feels tired during the day. Sleep study recommended in the past, but he will not complete  due to insurance issues.   Cataracts- followed by Dr. Gershon Crane. Last seen 11/21, told he is in early stages of cataracts.Advised to monitor yearly unless substantial change to vision. Sometimes has floaters. No issues driving at night. Denies change in vision.   History of 20 ft fall - incident occurred in 1994. Reports he fractured his sternum and T2/3/4. Use to take hydrocodone and valium in past for pain. Still has 3-4 tablets of valium left. Admits to taking neighbor hydrocodone when back pain is unbearable. Back and chest pain occur daily, often manageable on own. Will take combination of tylenol and ibuprofen. Reports taking ibuprofen 800 mg 3 times a month. Reports taking extra strength tylenol 4-5 times a week.  No recent falls, injuries or hospitalizations.   Family history of suicide- father died by carbon monoxide. He was first family member to find him dead. Completed grief therapy in the past  Does not drink alcohol.  Non-smoker. Past history of smoking tobacco and marijuana in high school.   Does not use illicit drugs.   Exercises weekly. Walks, uses free weights and does yard work.   Hernia repair surgery in 2014. Denies abdominal pain, protruding hernia or straining.   Dental- plans to have dental cleaning this year, followed by Dr. Tina Griffiths office. Denies dental pain or broken teeth.   Denies skin issues.   Cologard done in 2021. Reports negative result.   Completed covid- 19 series and booster.   Flu vaccine 03/2020.   Does not have living will or advance directives.    Review of Systems:  Review of Systems  Constitutional:  Positive for malaise/fatigue. Negative for fever.  HENT: Negative for hearing loss and sore throat.   Eyes: Negative for blurred vision.  Respiratory: Negative for cough, shortness of breath and wheezing.   Cardiovascular: Negative for chest pain, orthopnea and leg swelling.  Gastrointestinal: Negative for abdominal pain, constipation,  diarrhea, heartburn, nausea and vomiting.  Genitourinary: Negative for dysuria and frequency.  Musculoskeletal: Positive for back pain, joint pain, myalgias and neck pain. Negative for falls.  Skin:       Dry skin  Neurological: Negative for dizziness, weakness and headaches.  Psychiatric/Behavioral: Negative for depression. The patient is not nervous/anxious and does not have insomnia.     Past Medical History:  Diagnosis Date  . Anxiety   . Blood transfusion without reported diagnosis   . 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 Cough   . Depression   . Fracture    AA  . H/O hiatal hernia   . Hypertension    no PCP   Past Surgical History:  Procedure Laterality Date  . HERNIA REPAIR    . INSERTION OF MESH N/A 12/09/2012   Procedure: INSERTION OF MESH;  Surgeon: Madilyn Hook, DO;  Location: [place];  Service: General;  Laterality: N/A;  . UMBILICAL HERNIA REPAIR N/A 12/09/2012   Procedure: HERNIA REPAIR UMBILICAL ADULT;  Surgeon: Madilyn Hook, DO;  Location: [place];  Service: General;  Laterality: N/A;  . WRIST RECONSTRUCTION Right 1997   Social History:   reports that he has quit smoking. He has never used smokeless tobacco. He reports that he does not drink alcohol and does not use drugs.  No family history on file.  Medications: Patient's Medications  New Prescriptions   No medications on file  Previous Medications   DIAZEPAM (VALIUM) 5 MG TABLET    Take 5 mg by mouth 2 (two) times daily.   HYDROCODONE-ACETAMINOPHEN (NORCO) 5-325 MG PER TABLET    Take 1 tablet by mouth every 6 (six) hours as needed for pain.   LISINOPRIL (ZESTRIL) 30 MG TABLET    Take 30 mg by mouth daily.   METFORMIN (GLUCOPHAGE) 500 MG TABLET    Take 1,000 mg by mouth 2 (two) times daily.   SEMAGLUTIDE, 1 MG/DOSE, (OZEMPIC, 1 MG/DOSE,) 2 MG/1.5ML SOPN    Inject 0.25 mg into the skin daily in the afternoon.  Modified Medications   No medications on file  Discontinued Medications   LISINOPRIL  (PRINIVIL,ZESTRIL) 10 MG TABLET    Take 10 mg by mouth daily.    Physical Exam:  Vitals:   05/15/20 1333  Pulse: 97  Resp: 18  Temp: (!) 97.5 F (36.4 C)  TempSrc: Temporal  SpO2: 98%  Weight: 219 lb 6.4 oz (99.5 kg)  Height: 6' (1.829 m)   Body mass index is 29.76 kg/m. Wt Readings from Last 3 Encounters:  05/15/20 219 lb 6.4 oz (99.5 kg)  12/23/12 213 lb 6.4 oz (96.8 kg)  12/09/12 216 lb 11.4 oz (98.3 kg)    Physical Exam Vitals reviewed.  Constitutional:      General: He is not in acute distress. HENT:     Head: Normocephalic.  Neck:     Vascular: No carotid bruit.  Cardiovascular:     Rate and Rhythm: Normal rate and regular rhythm.     Pulses: Normal pulses.          Dorsalis pedis pulses are 2+ on the right side and 2+ on the left side.  Heart sounds: Normal heart sounds. No murmur heard.   Pulmonary:     Effort: Pulmonary effort is normal. No respiratory distress.     Breath sounds: Normal breath sounds. No wheezing.  Abdominal:     General: Bowel sounds are normal. There is no distension.     Palpations: Abdomen is soft.     Tenderness: There is no abdominal tenderness.  Musculoskeletal:     Cervical back: Normal range of motion.     Right lower leg: No edema.     Left lower leg: No edema.     Right foot: Normal range of motion.     Left foot: Normal range of motion.  Feet:     Right foot:     Protective Sensation: 10 sites tested. 10 sites sensed.     Toenail Condition: Right toenails are normal.     Left foot:     Protective Sensation: 10 sites tested. 10 sites sensed.     Toenail Condition: Left toenails are normal.  Lymphadenopathy:     Cervical: No cervical adenopathy.  Skin:    General: Skin is warm and dry.     Capillary Refill: Capillary refill takes less than 2 seconds.  Neurological:     General: No focal deficit present.     Mental Status: He is alert and oriented to person, place, and time.     Motor: No weakness.     Gait:  Gait normal.  Psychiatric:        Mood and Affect: Mood normal.        Behavior: Behavior normal.     Labs reviewed: Basic Metabolic Panel: No results for input(s): NA, K, CL, CO2, GLUCOSE, BUN, CREATININE, CALCIUM, MG, PHOS, TSH in the last 8760 hours. Liver Function Tests: No results for input(s): AST, ALT, ALKPHOS, BILITOT, PROT, ALBUMIN in the last 8760 hours. No results for input(s): LIPASE, AMYLASE in the last 8760 hours. No results for input(s): AMMONIA in the last 8760 hours. CBC: No results for input(s): WBC, NEUTROABS, HGB, HCT, MCV, PLT in the last 8760 hours. Lipid Panel: No results for input(s): CHOL, HDL, LDLCALC, TRIG, CHOLHDL, LDLDIRECT in the last 8760 hours. TSH: No results for input(s): TSH in the last 8760 hours. A1C: No results found for: HGBA1C   Assessment/Plan 1. Essential hypertension - bp rechecked, at goal < 140/80 - cont lisinopril - advise low sodium diet < 2000 mg /day - CBC with Differential/Platelets - Basic Metabolic Panel  2. Hyperlipidemia associated with type 2 diabetes mellitus ([place]) - due to diabetes - advised to follows diet low in fat and fried foods - Hepatic Function Panel - Lipid Panel  3. Cortical age-related cataract of both eyes - followed by Dr. Gershon Crane - beginning stages of cataracts noted, some floaters at times -denies changes in vision  4. Type 2 diabetes mellitus with hyperglycemia, without long-term current use of insulin (HCC) - asymptomatic, admits to following poor diet - recently stopped daily soda drinking - cont metformin and Ozempic - Hemoglobin A1c - B12 and Folate Panel  5. Caregiver burden - takes care of 78 y.o. mother without help - does not have many support symptoms - he denies depression and anxiety at this time  6. Chronic bilateral thoracic back pain - ongoing, due to 20 ft fall in 1994 - advised to start tylenol 1000 mg po bid - will at times use old hydrocodone or valium  7.  Arthritis - same as above  8. Family  history of suicide - father dies of self-inflicted carbon monoxide  9. BMI 29.0-29.9,adult - he started reducing portion size - advised to limit calories < 2000mg /day - advised to exercise 150 min/week  10. Fatigue, unspecified - suspect he has underlining sleep apnea - he refuses to go to sleep study at this time  I provided 57 minutes of face-to-face time during this encounter.     Next appt: Visit date not found [place], [facility] [phone]

## 2020-05-16 ENCOUNTER — Other Ambulatory Visit: Payer: Self-pay | Admitting: Orthopedic Surgery

## 2020-05-16 ENCOUNTER — Other Ambulatory Visit: Payer: Self-pay

## 2020-05-16 DIAGNOSIS — E1169 Type 2 diabetes mellitus with other specified complication: Secondary | ICD-10-CM

## 2020-05-16 DIAGNOSIS — E785 Hyperlipidemia, unspecified: Secondary | ICD-10-CM

## 2020-05-16 DIAGNOSIS — E1165 Type 2 diabetes mellitus with hyperglycemia: Secondary | ICD-10-CM

## 2020-05-16 DIAGNOSIS — I10 Essential (primary) hypertension: Secondary | ICD-10-CM

## 2020-05-16 LAB — B12 AND FOLATE PANEL
Folate: 15.5 ng/mL
Vitamin B-12: 342 pg/mL (ref 200–1100)

## 2020-05-16 LAB — HEPATIC FUNCTION PANEL
AG Ratio: 1.8 (calc) (ref 1.0–2.5)
ALT: 33 U/L (ref 9–46)
Albumin: 4.3 g/dL (ref 3.6–5.1)
Bilirubin, Direct: 0.2 mg/dL (ref 0.0–0.2)
Globulin: 2.4 g/dL (calc) (ref 1.9–3.7)
Indirect Bilirubin: 1.2 mg/dL (calc) (ref 0.2–1.2)
Total Bilirubin: 1.4 mg/dL — ABNORMAL HIGH (ref 0.2–1.2)
Total Protein: 6.7 g/dL (ref 6.1–8.1)

## 2020-05-16 LAB — HEMOGLOBIN A1C
Hgb A1c MFr Bld: 8.1 % of total Hgb — ABNORMAL HIGH (ref <5.7)
Mean Plasma Glucose: 186 mg/dL
eAG (mmol/L): 10.3 mmol/L

## 2020-05-16 LAB — CBC WITH DIFFERENTIAL/PLATELET
Absolute Monocytes: 462 cells/uL (ref 200–950)
Basophils Absolute: 59 cells/uL (ref 0–200)
Eosinophils Absolute: 151 cells/uL (ref 15–500)
Eosinophils Relative: 1.8 %
HCT: 43.9 % (ref 38.5–50.0)
Lymphs Abs: 2747 cells/uL (ref 850–3900)
MCH: 30.8 pg (ref 27.0–33.0)
MCHC: 34.9 g/dL (ref 32.0–36.0)
MPV: 10.1 fL (ref 7.5–12.5)
Monocytes Relative: 5.5 %
Neutro Abs: 4981 cells/uL (ref 1500–7800)
RBC: 4.96 10*6/uL (ref 4.20–5.80)
RDW: 12.3 % (ref 11.0–15.0)
Total Lymphocyte: 32.7 %
WBC: 8.4 10*3/uL (ref 3.8–10.8)

## 2020-05-16 LAB — LIPID PANEL
HDL: 49 mg/dL (ref >=40)
LDL Cholesterol (Calc): 125 mg/dL (calc) — ABNORMAL HIGH
Non-HDL Cholesterol (Calc): 166 mg/dL (calc) — ABNORMAL HIGH (ref <130)
Total CHOL/HDL Ratio: 4.4 (calc) (ref <5.0)
Triglycerides: 267 mg/dL — ABNORMAL HIGH (ref <150)

## 2020-05-16 LAB — BASIC METABOLIC PANEL
BUN/Creatinine Ratio: 16 (calc) (ref 6–22)
BUN: 11 mg/dL (ref 7–25)
Calcium: 9.5 mg/dL (ref 8.6–10.3)
Chloride: 103 mmol/L (ref 98–110)
Potassium: 4.3 mmol/L (ref 3.5–5.3)
Sodium: 137 mmol/L (ref 135–146)

## 2020-05-16 MED ORDER — LISINOPRIL 30 MG PO TABS: 30.0000 mg | ORAL_TABLET | Freq: Every day | ORAL | 3 refills | Status: DC

## 2020-05-16 MED ORDER — OZEMPIC (1 MG/DOSE) 2 MG/1.5ML ~~LOC~~ SOPN: 0.5000 mg | PEN_INJECTOR | Freq: Every day | SUBCUTANEOUS | 3 refills | Status: DC

## 2020-05-16 MED ORDER — METFORMIN HCL 500 MG PO TABS: 1000.0000 mg | ORAL_TABLET | Freq: Two times a day (BID) | ORAL | 3 refills | Status: DC

## 2020-10-01 ENCOUNTER — Other Ambulatory Visit: Payer: Self-pay | Admitting: Orthopedic Surgery

## 2020-10-01 DIAGNOSIS — I10 Essential (primary) hypertension: Secondary | ICD-10-CM

## 2020-11-20 ENCOUNTER — Ambulatory Visit: Payer: 59 | Admitting: Orthopedic Surgery

## 2020-12-06 ENCOUNTER — Encounter: Payer: Self-pay | Admitting: Orthopedic Surgery

## 2020-12-06 ENCOUNTER — Other Ambulatory Visit: Payer: Self-pay

## 2020-12-06 ENCOUNTER — Ambulatory Visit (INDEPENDENT_AMBULATORY_CARE_PROVIDER_SITE_OTHER): Payer: 59 | Admitting: Orthopedic Surgery

## 2020-12-06 VITALS — BP 152/82 | HR 108 | Temp 96.9°F | Ht 72.0 in | Wt 218.4 lb

## 2020-12-06 DIAGNOSIS — Z23 Encounter for immunization: Secondary | ICD-10-CM | POA: Diagnosis not present

## 2020-12-06 DIAGNOSIS — Z1159 Encounter for screening for other viral diseases: Secondary | ICD-10-CM

## 2020-12-06 DIAGNOSIS — E1165 Type 2 diabetes mellitus with hyperglycemia: Secondary | ICD-10-CM | POA: Diagnosis not present

## 2020-12-06 DIAGNOSIS — E785 Hyperlipidemia, unspecified: Secondary | ICD-10-CM

## 2020-12-06 DIAGNOSIS — I10 Essential (primary) hypertension: Secondary | ICD-10-CM | POA: Diagnosis not present

## 2020-12-06 DIAGNOSIS — Z6829 Body mass index (BMI) 29.0-29.9, adult: Secondary | ICD-10-CM

## 2020-12-06 DIAGNOSIS — A6001 Herpesviral infection of penis: Secondary | ICD-10-CM

## 2020-12-06 DIAGNOSIS — R000 Tachycardia, unspecified: Secondary | ICD-10-CM

## 2020-12-06 DIAGNOSIS — Z8719 Personal history of other diseases of the digestive system: Secondary | ICD-10-CM

## 2020-12-06 DIAGNOSIS — E1169 Type 2 diabetes mellitus with other specified complication: Secondary | ICD-10-CM

## 2020-12-06 DIAGNOSIS — Z114 Encounter for screening for human immunodeficiency virus [HIV]: Secondary | ICD-10-CM

## 2020-12-06 DIAGNOSIS — Z9889 Other specified postprocedural states: Secondary | ICD-10-CM

## 2020-12-06 MED ORDER — METOPROLOL TARTRATE 25 MG PO TABS: 12.5000 mg | ORAL_TABLET | Freq: Two times a day (BID) | ORAL | 3 refills | Status: DC

## 2020-12-06 MED ORDER — VALACYCLOVIR HCL 1 G PO TABS: 1000.0000 mg | ORAL_TABLET | Freq: Two times a day (BID) | ORAL | 3 refills | Status: AC

## 2020-12-06 NOTE — Patient Instructions (Addendum)
Allen Walters  Referral resent to [facility] about checking hernia.   Left hip pain- recommend 1000 mg 1-2 times daily as needed Voltaren gel 1%- may purchase OTC  Please start taking metoprolol- 1/2 tablet , twice daily. Please get blood pressure machine. Check blood pressures 2 hours after taking medication.

## 2020-12-06 NOTE — Progress Notes (Signed)
Careteam: Patient Care Team: Allen Alanis, NP as PCP - General (Adult Health Nurse Practitioner)  Seen by: [name], AGNP-C  PLACE OF SERVICE:  [place] Directive information    No Known Allergies  Chief Complaint  Patient presents with   Medical Management of Chronic Issues    Patient presents today for 6 month follow-up.   Quality Metric Gaps    A1C, Foot & eye exam, HIV and Hep C screening, zoster, flu, TDAP and COVID booster vaccines.     HPI: Patient is a 60 y.o. male seen today for medical management of chronic conditions.   Blood pressures continue to be elevated. He takes lisinopril daily for last 20 years. Does not follow low sodium diet. Does not check blood pressures at home.   LLQ intermittent pain within the 1-2 weeks. Pain rated 3-4/10, intermittent, described as sharp. Umbilical area and LLQ with area of firmness. Continues to work in [employer] and admits to heavy lifting at times. Hernia repair surgery 2014 by Dr. Lilyan Punt.   Still taking metformin and ozempic. Checks blood sugar weekly averages 130-200. Tries to limit carbs in diet and sweets.   Past history of genital warts. Given prescription for valcyclovir a few years ago. Prescription has expired, asking for new one. Last episode this summer, reports one painful lesion near shaft of penis.   Flu vaccine administered today.   Review of Systems:  Review of Systems  Constitutional:  Negative for chills, fever, malaise/fatigue and weight loss.  HENT: Negative.    Eyes: Negative.   Respiratory:  Negative for cough, shortness of breath and wheezing.   Cardiovascular:  Negative for chest pain and leg swelling.  Gastrointestinal:  Positive for abdominal pain. Negative for blood in stool, constipation, diarrhea, heartburn, nausea and vomiting.       Hx: hernia repair 2014  Genitourinary:  Negative for dysuria, frequency, hematuria and urgency.  Musculoskeletal:  Negative for falls and  joint pain.  Skin: Negative.   Neurological:  Negative for dizziness, tingling and weakness.  Psychiatric/Behavioral:  Negative for depression and memory loss. The patient is not nervous/anxious and does not have insomnia.    Past Medical History:  Diagnosis Date   Anxiety    Blood transfusion without reported diagnosis    Cancer ([place])    melonomia  on arms   Cough    Depression    Fracture    AA   H/O hiatal hernia    Hypertension    no PCP   Type 2 diabetes mellitus ([place])    Past Surgical History:  Procedure Laterality Date   HERNIA REPAIR     INSERTION OF MESH N/A 12/09/2012   Procedure: INSERTION OF MESH;  Surgeon: Madilyn Hook, DO;  Location: [facility];  Service: General;  Laterality: N/A;   UMBILICAL HERNIA REPAIR N/A 12/09/2012   Procedure: HERNIA REPAIR UMBILICAL ADULT;  Surgeon: Madilyn Hook, DO;  Location: [facility];  Service: General;  Laterality: N/A;   WRIST RECONSTRUCTION Right 1997   Social History:   reports that he has quit smoking. He has never used smokeless tobacco. He reports that he does not drink alcohol and does not use drugs.  No family history on file.  Medications: Patient's Medications  New Prescriptions   No medications on file  Previous Medications   ASPIRIN 81 MG EC TABLET    Take 1 tablet by mouth daily.   ATORVASTATIN (LIPITOR) 40 MG TABLET  Take 40 mg by mouth daily. Take 1/2 tablet by mouth daily   DIAZEPAM (VALIUM) 5 MG TABLET    Take 5 mg by mouth 2 (two) times daily.   HYDROCODONE-ACETAMINOPHEN (NORCO) 5-325 MG PER TABLET    Take 1 tablet by mouth every 6 (six) hours as needed for pain.   LISINOPRIL (ZESTRIL) 30 MG TABLET    Take 1 tablet by mouth once daily   METFORMIN (GLUCOPHAGE) 500 MG TABLET    Take 2 tablets (1,000 mg total) by mouth 2 (two) times daily.   SEMAGLUTIDE, 1 MG/DOSE, (OZEMPIC, 1 MG/DOSE,) 2 MG/1.5ML SOPN    Inject 0.5 mg into the skin daily in the afternoon. Please take 0.5 mg weekly for 2 weeks, then increase dose to 1 mg  weekly  Modified Medications   No medications on file  Discontinued Medications   No medications on file    Physical Exam:  Vitals:   12/06/20 1418  BP: (!) 146/96  Pulse: (!) 102  Temp: (!) 96.9 F (36.1 C)  SpO2: 98%  Weight: 218 lb 6.4 oz (99.1 kg)  Height: 6' (1.829 m)   Body mass index is 29.62 kg/m. Wt Readings from Last 3 Encounters:  12/06/20 218 lb 6.4 oz (99.1 kg)  05/15/20 219 lb 6.4 oz (99.5 kg)  12/23/12 213 lb 6.4 oz (96.8 kg)    Physical Exam Vitals reviewed.  Constitutional:      General: He is not in acute distress. HENT:     Head: Normocephalic.     Nose: Nose normal.     Mouth/Throat:     Mouth: Mucous membranes are moist.  Eyes:     General:        Right eye: No discharge.        Left eye: No discharge.  Neck:     Vascular: No carotid bruit.  Cardiovascular:     Rate and Rhythm: Normal rate and regular rhythm.     Pulses: Normal pulses.     Heart sounds: Normal heart sounds. No murmur heard. Pulmonary:     Effort: Pulmonary effort is normal. No respiratory distress.     Breath sounds: Normal breath sounds. No wheezing.  Abdominal:     General: Bowel sounds are normal. There is no distension.     Palpations: Abdomen is soft.     Tenderness: There is abdominal tenderness.     Comments: Area of tenderness noted between umbilical and LLQ, area feels firm, tenderness noted during exam, left abdomen shifted lower than right, no protrusions or deformities noted, no skin breakdown or shadowing.   Musculoskeletal:     Cervical back: Normal range of motion.     Right lower leg: No edema.     Left lower leg: No edema.  Lymphadenopathy:     Cervical: No cervical adenopathy.  Skin:    General: Skin is warm and dry.  Neurological:     General: No focal deficit present.     Mental Status: He is alert and oriented to person, place, and time.  Psychiatric:        Mood and Affect: Mood normal.        Behavior: Behavior normal.    Labs  reviewed: Basic Metabolic Panel: Recent Labs    05/15/20 1501  NA 137  K 4.3  CL 103  CO2 23  GLUCOSE 122*  BUN 11  CREATININE 0.69*  CALCIUM 9.5   Liver Function Tests: Recent Labs    05/15/20 1501  AST 26  ALT 33  BILITOT 1.4*  PROT 6.7   No results for input(s): LIPASE, AMYLASE in the last 8760 hours. No results for input(s): AMMONIA in the last 8760 hours. CBC: Recent Labs    05/15/20 1501  WBC 8.4  NEUTROABS 4,981  HGB 15.3  HCT 43.9  MCV 88.5  PLT 333   Lipid Panel: Recent Labs    05/15/20 1501  CHOL 215*  HDL 49  LDLCALC 125*  TRIG 267*  CHOLHDL 4.4   TSH: No results for input(s): TSH in the last 8760 hours. A1C: Lab Results  Component Value Date   HGBA1C 8.1 (H) 05/15/2020     Assessment/Plan 1. Essential hypertension - uncontrolled, goal < 130/80 - EKG 12-Lead- sinus tachycardia- HR 108 - CMP- BUN/creat 15/0.68 - CBC with Differential/Platelet- hgb 16.5 - cont lisinopril - start metoprolol 12.5 po bid  - advised to take blood pressures 2 hours after taking medications - f/u recommended in 2 weeks to check HR and bp  2. Need for influenza vaccination - Flu Vaccine QUAD 28mo+IM (Fluarix, Fluzone & Alfiuria Quad PF)  3. Herpes simplex infection of penis - episode over the summer, resolved - valACYclovir (VALTREX) 1000 MG tablet; Take 1 tablet (1,000 mg total) by mouth 2 (two) times daily for 10 days.  Dispense: 20 tablet; Refill: 3  4. Type 2 diabetes mellitus with hyperglycemia, without long-term current use of insulin (HCC) - cont metformin and Ozempic- will discuss increasing to 1 mg next visit - Hemoglobin A1c- 8.4 12/06/2020 - recommend weight loss - recommend diet low in carbs and sugars  5. Hyperlipidemia associated with type 2 diabetes mellitus (HCC) - Lipid Panel- LDL 113 12/06/2020 - cont lipitor  6. BMI 29.0-29.9,adult - discussed weight loss by reducing calories< 2000/day  7. S/P hernia repair - hard area felt  between umbilical region and LLQ - 2014 hernia repair surgery by Dr. Lilyan Punt- [facility] - Ambulatory referral to General Surgery  8. Screening for HIV (human immunodeficiency virus) - HIV antibody (with reflex)  9. Tachycardia - see above - metoprolol tartrate (LOPRESSOR) 25 MG tablet; Take 0.5 tablets (12.5 mg total) by mouth 2 (two) times daily.  Dispense: 30 tablet; Refill: 3  Total time: 38 minutes. Greater than 50% of total time spent doing patient education regarding blood pressure, tachycardia and diabetic management.   Next appt: 12/20/2020  [name], [facility] [phone]

## 2020-12-07 LAB — COMPREHENSIVE METABOLIC PANEL
AG Ratio: 1.7 (calc) (ref 1.0–2.5)
ALT: 32 U/L (ref 9–46)
AST: 26 U/L (ref 10–35)
Albumin: 4.5 g/dL (ref 3.6–5.1)
Alkaline phosphatase (APISO): 47 U/L (ref 35–144)
BUN/Creatinine Ratio: 22 (calc) (ref 6–22)
BUN: 15 mg/dL (ref 7–25)
CO2: 23 mmol/L (ref 20–32)
Calcium: 10.1 mg/dL (ref 8.6–10.3)
Chloride: 100 mmol/L (ref 98–110)
Creat: 0.68 mg/dL — ABNORMAL LOW (ref 0.70–1.35)
Globulin: 2.7 g/dL (calc) (ref 1.9–3.7)
Glucose, Bld: 179 mg/dL — ABNORMAL HIGH (ref 65–139)
Potassium: 4.4 mmol/L (ref 3.5–5.3)
Sodium: 134 mmol/L — ABNORMAL LOW (ref 135–146)
Total Bilirubin: 1.3 mg/dL — ABNORMAL HIGH (ref 0.2–1.2)
Total Protein: 7.2 g/dL (ref 6.1–8.1)

## 2020-12-07 LAB — CBC WITH DIFFERENTIAL/PLATELET
Absolute Monocytes: 578 cells/uL (ref 200–950)
Basophils Absolute: 51 cells/uL (ref 0–200)
Basophils Relative: 0.6 %
Eosinophils Absolute: 128 cells/uL (ref 15–500)
Eosinophils Relative: 1.5 %
HCT: 45.7 % (ref 38.5–50.0)
Hemoglobin: 16.1 g/dL (ref 13.2–17.1)
Lymphs Abs: 2618 cells/uL (ref 850–3900)
MCH: 31.8 pg (ref 27.0–33.0)
MCHC: 35.2 g/dL (ref 32.0–36.0)
MCV: 90.3 fL (ref 80.0–100.0)
MPV: 10.2 fL (ref 7.5–12.5)
Monocytes Relative: 6.8 %
Neutro Abs: 5126 cells/uL (ref 1500–7800)
Neutrophils Relative %: 60.3 %
Platelets: 334 10*3/uL (ref 140–400)
RBC: 5.06 10*6/uL (ref 4.20–5.80)
RDW: 12.6 % (ref 11.0–15.0)
Total Lymphocyte: 30.8 %
WBC: 8.5 10*3/uL (ref 3.8–10.8)

## 2020-12-07 LAB — HEMOGLOBIN A1C
Hgb A1c MFr Bld: 8.4 % of total Hgb — ABNORMAL HIGH (ref <5.7)
Mean Plasma Glucose: 194 mg/dL
eAG (mmol/L): 10.8 mmol/L

## 2020-12-07 LAB — LIPID PANEL
Cholesterol: 211 mg/dL — ABNORMAL HIGH (ref <200)
HDL: 47 mg/dL (ref >=40)
LDL Cholesterol (Calc): 113 mg/dL (calc) — ABNORMAL HIGH
Non-HDL Cholesterol (Calc): 164 mg/dL (calc) — ABNORMAL HIGH (ref <130)
Total CHOL/HDL Ratio: 4.5 (calc) (ref <5.0)
Triglycerides: 355 mg/dL — ABNORMAL HIGH (ref <150)

## 2020-12-07 LAB — HIV ANTIBODY (ROUTINE TESTING W REFLEX): HIV 1&2 Ab, 4th Generation: NONREACTIVE

## 2020-12-07 MED ORDER — OZEMPIC (1 MG/DOSE) 2 MG/1.5ML ~~LOC~~ SOPN: 1.0000 mg | PEN_INJECTOR | Freq: Every day | SUBCUTANEOUS | 3 refills | Status: DC

## 2020-12-08 ENCOUNTER — Other Ambulatory Visit: Payer: Self-pay | Admitting: Orthopedic Surgery

## 2020-12-08 DIAGNOSIS — I10 Essential (primary) hypertension: Secondary | ICD-10-CM

## 2020-12-10 ENCOUNTER — Other Ambulatory Visit: Payer: Self-pay | Admitting: *Deleted

## 2020-12-10 DIAGNOSIS — E1165 Type 2 diabetes mellitus with hyperglycemia: Secondary | ICD-10-CM

## 2020-12-10 MED ORDER — OZEMPIC (1 MG/DOSE) 2 MG/1.5ML ~~LOC~~ SOPN: 1.0000 mg | PEN_INJECTOR | SUBCUTANEOUS | 3 refills | Status: DC

## 2020-12-10 NOTE — Telephone Encounter (Signed)
Pharmacy called and stated that they received a Rx for patient's Ozempic Titration and stated that patient has already did his titration and needs a updated Rx.   Medication list updated and sent to Amy for approval.

## 2020-12-20 ENCOUNTER — Encounter: Payer: Self-pay | Admitting: Orthopedic Surgery

## 2020-12-20 ENCOUNTER — Other Ambulatory Visit: Payer: Self-pay

## 2020-12-20 ENCOUNTER — Ambulatory Visit (INDEPENDENT_AMBULATORY_CARE_PROVIDER_SITE_OTHER): Payer: 59 | Admitting: Orthopedic Surgery

## 2020-12-20 VITALS — BP 142/92 | HR 95 | Temp 97.8°F | Ht 72.0 in | Wt 217.0 lb

## 2020-12-20 DIAGNOSIS — L84 Corns and callosities: Secondary | ICD-10-CM

## 2020-12-20 DIAGNOSIS — R000 Tachycardia, unspecified: Secondary | ICD-10-CM

## 2020-12-20 DIAGNOSIS — Z8719 Personal history of other diseases of the digestive system: Secondary | ICD-10-CM

## 2020-12-20 DIAGNOSIS — I10 Essential (primary) hypertension: Secondary | ICD-10-CM

## 2020-12-20 DIAGNOSIS — Z9889 Other specified postprocedural states: Secondary | ICD-10-CM

## 2020-12-20 DIAGNOSIS — E1165 Type 2 diabetes mellitus with hyperglycemia: Secondary | ICD-10-CM | POA: Diagnosis not present

## 2020-12-20 DIAGNOSIS — Z636 Dependent relative needing care at home: Secondary | ICD-10-CM

## 2020-12-20 NOTE — Patient Instructions (Addendum)
Please take metoprolol twice a day.   Soak feet in warm water for about 30 minutes, scrub feel with hard pumice sponge, put Cerave lotion on feet and put vaseline over lotion.

## 2020-12-20 NOTE — Progress Notes (Signed)
Careteam: Patient Care Team: Allen Alanis, NP as PCP - General (Adult Health Nurse Practitioner)  Seen by: [name], AGNP-C  PLACE OF SERVICE:  [place] Directive information    No Known Allergies  Chief Complaint  Patient presents with   Follow-up    2 week follow-up on heart rate and blood pressure. Patient admits to missing medication at times (forgets to take). Foot exam today.      HPI: Patient is a 60 y.o. male seen today for medical management of chronic conditions.   Notified he will not have insurance coverage as of Jan 1st 2023. He currently has [employer] and working with [employer].   Heart rate elevated in office today. He has been taking metoprolol, but admits to missing a few doses. Also, taking lisinopril. Reports increased heart rate since childhood. We rechecked his heart rate after he was sitting for a few minutes and it decreased to 90's. Admits life is stressful, he takes care of his mother full time. Also he is out of work at the moment. Denies panic attacks or anxiety. Discussed treatment options. He is not interested in seeing cardiologist at this time.  Does not want foot exam today. Reports callus to left heel. Asking for treatment options. Left heel sometimes painful and cracked.   Abdominal pain has decreased from last encounter. He has yet to contact [facility].   Does not want pneumonia or flu vaccine today.      Review of Systems:  Review of Systems  Constitutional:  Negative for chills, fever, malaise/fatigue and weight loss.  HENT: Negative.    Eyes: Negative.   Respiratory:  Negative for cough, shortness of breath and wheezing.   Cardiovascular:  Negative for chest pain, palpitations, orthopnea and leg swelling.  Gastrointestinal: Negative.   Genitourinary: Negative.   Musculoskeletal: Negative.   Skin:        Left heel callus  Neurological:  Negative for dizziness, weakness and headaches.   Psychiatric/Behavioral:  Negative for depression. The patient is not nervous/anxious and does not have insomnia.        Increased stress   Past Medical History:  Diagnosis Date   Anxiety    Blood transfusion without reported diagnosis    Cancer ([place])    melonomia  on arms   Cough    Depression    Fracture    AA   H/O hiatal hernia    Hypertension    no PCP   Type 2 diabetes mellitus ([place])    Past Surgical History:  Procedure Laterality Date   HERNIA REPAIR     INSERTION OF MESH N/A 12/09/2012   Procedure: INSERTION OF MESH;  Surgeon: Madilyn Hook, DO;  Location: [facility];  Service: General;  Laterality: N/A;   UMBILICAL HERNIA REPAIR N/A 12/09/2012   Procedure: HERNIA REPAIR UMBILICAL ADULT;  Surgeon: Madilyn Hook, DO;  Location: [facility];  Service: General;  Laterality: N/A;   WRIST RECONSTRUCTION Right 1997   Social History:   reports that he has quit smoking. He has never used smokeless tobacco. He reports that he does not drink alcohol and does not use drugs.  History reviewed. No pertinent family history.  Medications: Patient's Medications  New Prescriptions   No medications on file  Previous Medications   ASPIRIN 81 MG EC TABLET    Take 1 tablet by mouth daily.   ATORVASTATIN (LIPITOR) 40 MG TABLET    Take 40 mg by mouth  daily. Take 1/2 tablet by mouth daily   DIAZEPAM (VALIUM) 5 MG TABLET    Take 5 mg by mouth 2 (two) times daily.   HYDROCODONE-ACETAMINOPHEN (NORCO) 5-325 MG PER TABLET    Take 1 tablet by mouth every 6 (six) hours as needed for pain.   LISINOPRIL (ZESTRIL) 30 MG TABLET    Take 1 tablet by mouth once daily   METFORMIN (GLUCOPHAGE) 500 MG TABLET    Take 2 tablets (1,000 mg total) by mouth 2 (two) times daily.   METOPROLOL TARTRATE (LOPRESSOR) 25 MG TABLET    Take 0.5 tablets (12.5 mg total) by mouth 2 (two) times daily.   SEMAGLUTIDE, 1 MG/DOSE, (OZEMPIC, 1 MG/DOSE,) 2 MG/1.5ML SOPN    Inject 1 mg into the skin once a week.  Modified Medications   No  medications on file  Discontinued Medications   No medications on file    Physical Exam:  Vitals:   12/20/20 1407  BP: (!) 142/92  Pulse: (!) 106  Temp: 97.8 F (36.6 C)  TempSrc: Temporal  SpO2: 97%  Weight: 217 lb (98.4 kg)  Height: 6' (1.829 m)   Body mass index is 29.43 kg/m. Wt Readings from Last 3 Encounters:  12/20/20 217 lb (98.4 kg)  12/06/20 218 lb 6.4 oz (99.1 kg)  05/15/20 219 lb 6.4 oz (99.5 kg)    Physical Exam Vitals reviewed.  Constitutional:      General: He is not in acute distress. HENT:     Head: Normocephalic.  Eyes:     General:        Right eye: No discharge.        Left eye: No discharge.  Neck:     Vascular: No carotid bruit.  Cardiovascular:     Rate and Rhythm: Normal rate and regular rhythm.     Pulses: Normal pulses.     Heart sounds: Normal heart sounds.     Comments: Apical pulse 92 Pulmonary:     Effort: Pulmonary effort is normal. No respiratory distress.     Breath sounds: Normal breath sounds. No wheezing.  Abdominal:     General: Bowel sounds are normal. There is no distension.     Palpations: Abdomen is soft. There is no mass.     Tenderness: There is no abdominal tenderness.     Hernia: No hernia is present.     Comments: Abdomen appears more symmetrical from last encounter, nontender, no deformities noted  Musculoskeletal:     Cervical back: Normal range of motion.     Right lower leg: No edema.     Left lower leg: No edema.  Lymphadenopathy:     Cervical: No cervical adenopathy.  Skin:    General: Skin is warm and dry.     Capillary Refill: Capillary refill takes less than 2 seconds.  Neurological:     General: No focal deficit present.     Mental Status: He is alert and oriented to person, place, and time.  Psychiatric:        Mood and Affect: Mood normal.        Behavior: Behavior normal.    Labs reviewed: Basic Metabolic Panel: Recent Labs    05/15/20 1501 12/06/20 1517  NA 137 134*  K 4.3 4.4  CL  103 100  CO2 23 23  GLUCOSE 122* 179*  BUN 11 15  CREATININE 0.69* 0.68*  CALCIUM 9.5 10.1   Liver Function Tests: Recent Labs    05/15/20 1501  12/06/20 1517  AST 26 26  ALT 33 32  BILITOT 1.4* 1.3*  PROT 6.7 7.2   No results for input(s): LIPASE, AMYLASE in the last 8760 hours. No results for input(s): AMMONIA in the last 8760 hours. CBC: Recent Labs    05/15/20 1501 12/06/20 1517  WBC 8.4 8.5  NEUTROABS 4,981 5,126  HGB 15.3 16.1  HCT 43.9 45.7  MCV 88.5 90.3  PLT 333 334   Lipid Panel: Recent Labs    05/15/20 1501 12/06/20 1517  CHOL 215* 211*  HDL 49 47  LDLCALC 125* 113*  TRIG 267* 355*  CHOLHDL 4.4 4.5   TSH: No results for input(s): TSH in the last 8760 hours. A1C: Lab Results  Component Value Date   HGBA1C 8.4 (H) 12/06/2020     Assessment/Plan 1. S/P hernia repair - abdomen appears more symmetrical, non tender - admits to decreased pain today - Ambulatory referral to General Surgery- advised to contact [facility] for f/u  2. Tachycardia - increased rate initially, Apical pulse 92, rechecked with pulse ox 95 - cont metoprolol - advised to contact PCP if any chest pain, dizziness or palpitations occur  3. Essential hypertension - controlled - cont metoprolol and lisinopril - recommend low sodium diet  4. Type 2 diabetes mellitus with hyperglycemia, without long-term current use of insulin (HCC) - A1c 8.4 12/06/2020 - cont metformin and asa - Ozempic recently increased  5. Caregiver stress - denies increased anxiety or panic attacks - cares for mother full time - discussed relaxing and meditation  6. Callus of foot - reports callus to left heel - advised to soak feel in warm water for 30 min, scrub skin with pumice, apply cerave moisturizer and vaseline 3-5x/weekly  Total time: 33 minutes. Greater than 50% of total time spent doing patient education regarding symptom and medication management.    Next appt: Visit  date not found  [place], [facility] [phone]

## 2021-01-29 ENCOUNTER — Other Ambulatory Visit: Payer: Self-pay | Admitting: Orthopedic Surgery

## 2021-01-29 DIAGNOSIS — E1165 Type 2 diabetes mellitus with hyperglycemia: Secondary | ICD-10-CM

## 2022-02-26 ENCOUNTER — Ambulatory Visit (INDEPENDENT_AMBULATORY_CARE_PROVIDER_SITE_OTHER): Payer: Self-pay | Admitting: Adult Health

## 2022-02-26 ENCOUNTER — Encounter: Payer: Self-pay | Admitting: Adult Health

## 2022-02-26 VITALS — BP 170/80 | Temp 101.7°F | Resp 114 | Ht 70.0 in

## 2022-02-26 DIAGNOSIS — U071 COVID-19: Secondary | ICD-10-CM

## 2022-02-26 DIAGNOSIS — R000 Tachycardia, unspecified: Secondary | ICD-10-CM

## 2022-02-26 DIAGNOSIS — I10 Essential (primary) hypertension: Secondary | ICD-10-CM

## 2022-02-26 DIAGNOSIS — E1165 Type 2 diabetes mellitus with hyperglycemia: Secondary | ICD-10-CM

## 2022-02-26 MED ORDER — LISINOPRIL 30 MG PO TABS: 30.0000 mg | ORAL_TABLET | Freq: Every day | ORAL | 1 refills | Status: DC

## 2022-02-26 MED ORDER — OZEMPIC (1 MG/DOSE) 2 MG/1.5ML ~~LOC~~ SOPN: 1.0000 mg | PEN_INJECTOR | SUBCUTANEOUS | 3 refills | Status: AC

## 2022-02-26 MED ORDER — METFORMIN HCL 500 MG PO TABS: 1000.0000 mg | ORAL_TABLET | Freq: Two times a day (BID) | ORAL | 0 refills | Status: DC

## 2022-02-26 MED ORDER — BENZONATATE 100 MG PO CAPS: 100.0000 mg | ORAL_CAPSULE | Freq: Three times a day (TID) | ORAL | 0 refills | Status: AC | PRN

## 2022-02-26 MED ORDER — METOPROLOL TARTRATE 25 MG PO TABS: 12.5000 mg | ORAL_TABLET | Freq: Two times a day (BID) | ORAL | 3 refills | Status: AC

## 2022-02-26 MED ORDER — MOLNUPIRAVIR EUA 200MG CAPSULE: 4.0000 | ORAL_CAPSULE | Freq: Two times a day (BID) | ORAL | 0 refills | Status: AC

## 2022-02-26 NOTE — Progress Notes (Signed)
[facility]  Provider: Durenda Age DNP  Code Status:  Full Code  Goals of Care:     05/15/2020    1:45 PM  Advanced Directives  Does Patient Have a Medical Advance Directive? No  Would patient like information on creating a medical advance directive? No - Patient declined     Chief Complaint  Patient presents with   Acute Visit    Patient tested positive for covid on 02/25/22 and would like to be seen for treatment     HPI: Patient is a 61 y.o. male seen today for an acute visit for + COVID-19 test yesterday, 02/25/22.  He reported starting having sinus congestion, sore throat, feeling tired, fever of 101.0, body aches and  dry cough 3 days ago. He denies having shortness of breath.   Today, BP 170/100 and HR 114. He has ran out lisinopril and metoprolol. He denies having headache.   He is also requesting prescription for his Metformin and Ozempic  Past Medical History:  Diagnosis Date   Anxiety    Blood transfusion without reported diagnosis    Cancer ([place])    melonomia  on arms   Cough    Depression    Fracture    AA   H/O hiatal hernia    Hypertension    no PCP   Type 2 diabetes mellitus ([place])     Past Surgical History:  Procedure Laterality Date   HERNIA REPAIR     INSERTION OF MESH N/A 12/09/2012   Procedure: INSERTION OF MESH;  Surgeon: Madilyn Hook, DO;  Location: [facility];  Service: General;  Laterality: N/A;   UMBILICAL HERNIA REPAIR N/A 12/09/2012   Procedure: HERNIA REPAIR UMBILICAL ADULT;  Surgeon: Madilyn Hook, DO;  Location: [facility];  Service: General;  Laterality: N/A;   WRIST RECONSTRUCTION Right 1997    No Known Allergies  Outpatient Encounter Medications as of 02/26/2022  Medication Sig   aspirin 81 MG EC tablet Take 1 tablet by mouth daily.   atorvastatin (LIPITOR) 40 MG tablet Take 40 mg by mouth daily. Take 1/2 tablet by mouth daily   benzonatate (TESSALON PERLES) 100 MG capsule Take 1 capsule (100 mg total) by mouth 3 (three) times  daily as needed for cough.   diazepam (VALIUM) 5 MG tablet Take 5 mg by mouth 2 (two) times daily.   HYDROcodone-acetaminophen (NORCO) 5-325 MG per tablet Take 1 tablet by mouth every 6 (six) hours as needed for pain.   [EXPIRED] molnupiravir EUA (LAGEVRIO) 200 mg CAPS capsule Take 4 capsules (800 mg total) by mouth 2 (two) times daily for 5 days.   [DISCONTINUED] lisinopril (ZESTRIL) 30 MG tablet Take 1 tablet by mouth once daily   [DISCONTINUED] metFORMIN (GLUCOPHAGE) 500 MG tablet Take 2 tablets by mouth twice daily   [DISCONTINUED] metoprolol tartrate (LOPRESSOR) 25 MG tablet Take 0.5 tablets (12.5 mg total) by mouth 2 (two) times daily.   [DISCONTINUED] Semaglutide, 1 MG/DOSE, (OZEMPIC, 1 MG/DOSE,) 2 MG/1.5ML SOPN Inject 1 mg into the skin once a week.   lisinopril (ZESTRIL) 30 MG tablet Take 1 tablet (30 mg total) by mouth daily.   metFORMIN (GLUCOPHAGE) 500 MG tablet Take 2 tablets (1,000 mg total) by mouth 2 (two) times daily.   metoprolol tartrate (LOPRESSOR) 25 MG tablet Take 0.5 tablets (12.5 mg total) by mouth 2 (two) times daily.   Semaglutide, 1 MG/DOSE, (OZEMPIC, 1 MG/DOSE,) 2 MG/1.5ML SOPN Inject 1 mg into the skin once a week.  No [facility]-administered encounter medications on file as of 02/26/2022.    Review of Systems:  Review of Systems  Constitutional:  Positive for fever. Negative for activity change and appetite change.  HENT:  Positive for congestion and sore throat.   Eyes: Negative.   Respiratory:  Positive for cough.   Cardiovascular:  Negative for chest pain and leg swelling.  Gastrointestinal:  Negative for abdominal distention, diarrhea and vomiting.  Genitourinary:  Negative for dysuria, frequency and urgency.  Skin:  Negative for color change.  Neurological:  Negative for dizziness and headaches.  Psychiatric/Behavioral:  Negative for behavioral problems and sleep disturbance. The patient is not nervous/anxious.     Health Maintenance  Topic Date Due    FOOT EXAM  Never done   Diabetic kidney evaluation - Urine ACR  Never done   Hepatitis C Screening  Never done   DTaP/Tdap/Td (1 - Tdap) Never done   Zoster Vaccines- Shingrix (1 of 2) Never done   HEMOGLOBIN A1C  06/06/2021   INFLUENZA VACCINE  10/01/2021   COVID-19 Vaccine (3 - 2023-24 season) 11/01/2021   OPHTHALMOLOGY EXAM  12/01/2021   Diabetic kidney evaluation - eGFR measurement  12/06/2021   COLONOSCOPY (Pts 45-7yr Insurance coverage will need to be confirmed)  03/03/2029   HIV Screening  Completed   HPV VACCINES  Aged Out    Physical Exam: Vitals:   02/26/22 1434  BP: (!) 170/80  Resp: (!) 114  Temp: (!) 101.7 F (38.7 C)  Height: _0  (1.778 m)   Body mass index is 31.14 kg/m. Physical Exam Constitutional:      Appearance: Normal appearance.  HENT:     Head: Normocephalic and atraumatic.     Mouth/Throat:     Mouth: Mucous membranes are moist.  Eyes:     Conjunctiva/sclera: Conjunctivae normal.  Cardiovascular:     Rate and Rhythm: Regular rhythm. Tachycardia present.     Pulses: Normal pulses.     Heart sounds: Normal heart sounds.  Pulmonary:     Effort: Pulmonary effort is normal.     Breath sounds: Normal breath sounds.  Abdominal:     General: Bowel sounds are normal.     Palpations: Abdomen is soft.  Musculoskeletal:        General: No swelling. Normal range of motion.     Cervical back: Normal range of motion.  Skin:    General: Skin is warm and dry.  Neurological:     General: No focal deficit present.     Mental Status: He is alert and oriented to person, place, and time.  Psychiatric:        Mood and Affect: Mood normal.        Behavior: Behavior normal.        Thought Content: Thought content normal.        Judgment: Judgment normal.     Labs reviewed: Basic Metabolic Panel: No results for input(s): "NA", "K", "CL", "CO2", "GLUCOSE", "BUN", "CREATININE", "CALCIUM", "MG", "PHOS", "TSH" in the last 8760 hours. Liver Function  Tests: No results for input(s): "AST", "ALT", "ALKPHOS", "BILITOT", "PROT", "ALBUMIN" in the last 8760 hours. No results for input(s): "LIPASE", "AMYLASE" in the last 8760 hours. No results for input(s): "AMMONIA" in the last 8760 hours. CBC: No results for input(s): "WBC", "NEUTROABS", "HGB", "HCT", "MCV", "PLT" in the last 8760 hours. Lipid Panel: No results for input(s): "CHOL", "HDL", "LDLCALC", "TRIG", "CHOLHDL", "LDLDIRECT" in the last 8760 hours. Lab Results  Component Value Date  HGBA1C 8.4 (H) 12/06/2020    Procedures since last visit: No results found.  Assessment/Plan  1. COVID-19 -  instructed to use mask/ isolate X 10 days - molnupiravir EUA (LAGEVRIO) 200 mg CAPS capsule; Take 4 capsules (800 mg total) by mouth 2 (two) times daily for 5 days.  Dispense: 40 capsule; Refill: 0 - benzonatate (TESSALON PERLES) 100 MG capsule; Take 1 capsule (100 mg total) by mouth 3 (three) times daily as needed for cough.  Dispense: 30 capsule; Refill: 0  2. Type 2 diabetes mellitus with hyperglycemia, without long-term current use of insulin (HCC) Lab Results  Component Value Date   HGBA1C 8.4 (H) 12/06/2020   - metFORMIN (GLUCOPHAGE) 500 MG tablet; Take 2 tablets (1,000 mg total) by mouth 2 (two) times daily.  Dispense: 120 tablet; Refill: 0 - Semaglutide, 1 MG/DOSE, (OZEMPIC, 1 MG/DOSE,) 2 MG/1.5ML SOPN; Inject 1 mg into the skin once a week.  Dispense: 4.5 mL; Refill: 3  3. Essential hypertension -  monitor BP daily - metoprolol tartrate (LOPRESSOR) 25 MG tablet; Take 0.5 tablets (12.5 mg total) by mouth 2 (two) times daily.  Dispense: 30 tablet; Refill: 3 - lisinopril (ZESTRIL) 30 MG tablet; Take 1 tablet (30 mg total) by mouth daily.  Dispense: 90 tablet; Refill: 1  4. Tachycardia -   monitor HR - metoprolol tartrate (LOPRESSOR) 25 MG tablet; Take 0.5 tablets (12.5 mg total) by mouth 2 (two) times daily.  Dispense: 30 tablet; Refill: 3     Labs/tests ordered:  None  Next  appt:  as needed

## 2022-02-26 NOTE — Patient Instructions (Signed)

## 2022-06-27 ENCOUNTER — Telehealth: Payer: Self-pay

## 2022-06-27 NOTE — Telephone Encounter (Signed)
I called patient to have him follow up with PCP Octavia Heir, NP . Patient last visit with PCP was October 2023. Patient was instructed to come back in 2 weeks for BP / HR check.

## 2023-01-21 ENCOUNTER — Other Ambulatory Visit: Payer: Self-pay

## 2023-01-21 DIAGNOSIS — E1165 Type 2 diabetes mellitus with hyperglycemia: Secondary | ICD-10-CM

## 2023-01-21 DIAGNOSIS — I10 Essential (primary) hypertension: Secondary | ICD-10-CM

## 2023-01-21 MED ORDER — LISINOPRIL 30 MG PO TABS: 30.0000 mg | ORAL_TABLET | Freq: Every day | ORAL | 1 refills | Status: AC

## 2023-01-21 MED ORDER — METFORMIN HCL 500 MG PO TABS: 1000.0000 mg | ORAL_TABLET | Freq: Two times a day (BID) | ORAL | 1 refills | Status: AC

## 2023-01-21 NOTE — Telephone Encounter (Signed)
Patient came in office to schedule appointment for January due to insurance issue. He would like to know if Metformin and Lisinopril could be send into pharmacy for him until his appointment. Please advise

## 2023-03-19 ENCOUNTER — Ambulatory Visit: Payer: Self-pay | Admitting: Orthopedic Surgery
